# Patient Record
Sex: Female | Born: 1995
Health system: Southern US, Community
[De-identification: ages and names within clinical notes are randomized; demographics above are authoritative.]

## PROBLEM LIST (undated history)

## (undated) DIAGNOSIS — N390 Urinary tract infection, site not specified: Secondary | ICD-10-CM

## (undated) DIAGNOSIS — F329 Major depressive disorder, single episode, unspecified: Secondary | ICD-10-CM

## (undated) DIAGNOSIS — R1110 Vomiting, unspecified: Secondary | ICD-10-CM

## (undated) DIAGNOSIS — F1210 Cannabis abuse, uncomplicated: Secondary | ICD-10-CM

## (undated) DIAGNOSIS — F32A Depression, unspecified: Secondary | ICD-10-CM

## (undated) DIAGNOSIS — G43909 Migraine, unspecified, not intractable, without status migrainosus: Secondary | ICD-10-CM

---
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
## 2017-01-12 ENCOUNTER — Encounter (HOSPITAL_COMMUNITY): Payer: Self-pay | Admitting: Nurse Practitioner

## 2017-01-12 ENCOUNTER — Emergency Department (HOSPITAL_COMMUNITY)
Admission: EM | Admit: 2017-01-12 | Discharge: 2017-01-12 | Disposition: A | Discharge status: Home / Self Care | Payer: Medicaid Other | Attending: Emergency Medicine | Admitting: Emergency Medicine

## 2017-01-12 DIAGNOSIS — K2900 Acute gastritis without bleeding: Secondary | ICD-10-CM | POA: Insufficient documentation

## 2017-01-12 DIAGNOSIS — R112 Nausea with vomiting, unspecified: Secondary | ICD-10-CM | POA: Diagnosis not present

## 2017-01-12 DIAGNOSIS — R1013 Epigastric pain: Secondary | ICD-10-CM | POA: Diagnosis present

## 2017-01-12 DIAGNOSIS — F17200 Nicotine dependence, unspecified, uncomplicated: Secondary | ICD-10-CM | POA: Insufficient documentation

## 2017-01-12 DIAGNOSIS — F12988 Cannabis use, unspecified with other cannabis-induced disorder: Secondary | ICD-10-CM | POA: Insufficient documentation

## 2017-01-12 DIAGNOSIS — Z79899 Other long term (current) drug therapy: Secondary | ICD-10-CM | POA: Diagnosis not present

## 2017-01-12 DIAGNOSIS — F1290 Cannabis use, unspecified, uncomplicated: Secondary | ICD-10-CM

## 2017-01-12 HISTORY — DX: Migraine, unspecified, not intractable, without status migrainosus: G43.909

## 2017-01-12 LAB — COMPREHENSIVE METABOLIC PANEL
ALBUMIN: 5.3 g/dL — AB (ref 3.5–5.0)
ALK PHOS: 56 U/L (ref 38–126)
ALT: 13 U/L — ABNORMAL LOW (ref 14–54)
ANION GAP: 16 — AB (ref 5–15)
AST: 36 U/L (ref 15–41)
BUN: 10 mg/dL (ref 6–20)
CALCIUM: 10.2 mg/dL (ref 8.9–10.3)
CO2: 15 mmol/L — ABNORMAL LOW (ref 22–32)
Chloride: 109 mmol/L (ref 101–111)
Creatinine, Ser: 0.95 mg/dL (ref 0.44–1.00)
GFR calc non Af Amer: >60 mL/min (ref >60)
GLUCOSE: 168 mg/dL — AB (ref 65–99)
POTASSIUM: 3.8 mmol/L (ref 3.5–5.1)
Sodium: 140 mmol/L (ref 135–145)
TOTAL PROTEIN: 7.8 g/dL (ref 6.5–8.1)
Total Bilirubin: 0.8 mg/dL (ref 0.3–1.2)

## 2017-01-12 LAB — CBC
HCT: 42.2 % (ref 36.0–46.0)
HEMOGLOBIN: 14.9 g/dL (ref 12.0–15.0)
MCH: 32.5 pg (ref 26.0–34.0)
MCHC: 35.3 g/dL (ref 30.0–36.0)
MCV: 91.9 fL (ref 78.0–100.0)
Platelets: 289 10*3/uL (ref 150–400)
RBC: 4.59 MIL/uL (ref 3.87–5.11)
RDW: 12.3 % (ref 11.5–15.5)
WBC: 17.6 10*3/uL — ABNORMAL HIGH (ref 4.0–10.5)

## 2017-01-12 LAB — RAPID URINE DRUG SCREEN, HOSP PERFORMED
AMPHETAMINES: NOT DETECTED
Barbiturates: NOT DETECTED
Benzodiazepines: NOT DETECTED
COCAINE: NOT DETECTED
OPIATES: NOT DETECTED
Tetrahydrocannabinol: POSITIVE — AB

## 2017-01-12 LAB — URINALYSIS, ROUTINE W REFLEX MICROSCOPIC
Bilirubin Urine: NEGATIVE
GLUCOSE, UA: NEGATIVE mg/dL
KETONES UR: 20 mg/dL — AB
LEUKOCYTES UA: NEGATIVE
NITRITE: NEGATIVE
PH: 6 (ref 5.0–8.0)
Protein, ur: NEGATIVE mg/dL
Specific Gravity, Urine: 1.019 (ref 1.005–1.030)

## 2017-01-12 LAB — I-STAT BETA HCG BLOOD, ED (MC, WL, AP ONLY): I-stat hCG, quantitative: <5 m[IU]/mL (ref <5)

## 2017-01-12 MED ORDER — FAMOTIDINE IN NACL 20-0.9 MG/50ML-% IV SOLN
20.0000 mg | Freq: Once | INTRAVENOUS | Status: AC
  Administered 2017-01-12: 20 mg via INTRAVENOUS
  Filled 2017-01-12: qty 50

## 2017-01-12 MED ORDER — ONDANSETRON HCL 4 MG/2ML IJ SOLN
4.0000 mg | Freq: Once | INTRAMUSCULAR | Status: AC
  Administered 2017-01-12: 4 mg via INTRAVENOUS
  Filled 2017-01-12: qty 2

## 2017-01-12 MED ORDER — SODIUM CHLORIDE 0.9 % IV BOLUS (SEPSIS)
500.0000 mL | Freq: Once | INTRAVENOUS | Status: AC
  Administered 2017-01-12: 500 mL via INTRAVENOUS

## 2017-01-12 MED ORDER — SODIUM CHLORIDE 0.9 % IV BOLUS (SEPSIS)
1000.0000 mL | Freq: Once | INTRAVENOUS | Status: AC
  Administered 2017-01-12: 1000 mL via INTRAVENOUS

## 2017-01-12 MED ORDER — ONDANSETRON 4 MG PO TBDP: 4.0000 mg | ORAL_TABLET | Freq: Three times a day (TID) | ORAL | 0 refills | Status: DC | PRN

## 2017-01-12 MED ORDER — LORAZEPAM 2 MG/ML IJ SOLN
1.0000 mg | Freq: Once | INTRAMUSCULAR | Status: AC
  Administered 2017-01-12: 1 mg via INTRAVENOUS
  Filled 2017-01-12: qty 1

## 2017-01-12 MED ORDER — METOCLOPRAMIDE HCL 5 MG/ML IJ SOLN
10.0000 mg | Freq: Once | INTRAMUSCULAR | Status: AC
  Administered 2017-01-12: 10 mg via INTRAVENOUS
  Filled 2017-01-12: qty 2

## 2017-01-12 NOTE — ED Provider Notes (Signed)
[facility]-EMERGENCY DEPT Provider Note   CSN: [id number] Arrival date & time: 01/12/17  1207     History   Chief Complaint Chief Complaint  Patient presents with  . Emesis    HPI Tiffany Williamson is a 21 y.o. female.  Patient w recurrent nausea and vomiting in the past day. Several episodes. No bloody emesis. +epigastric pain, dull, moderate, non radiating. No abd distension. No diarrhea. Is having normal bms. No prior abd surgeries, no dysuria or gu c/o.  Hx ibs. Parent indicates had ibs workup including colonoscopy which was fine. No fever or chills. Lnmp: on current.       Past Medical History:  Diagnosis Date  . Migraine     Patient Active Problem List   Diagnosis Date Noted  . Vomiting 10/28/2016  . Intractable vomiting 10/28/2016  . Depression 10/28/2016  . Persistent proteinuria 10/28/2016  . Abdominal pain 10/28/2016  . Total bilirubin, elevated 10/28/2016  . Hypokalemia 10/28/2016    History reviewed. No pertinent surgical history.  OB History    No data available       Home Medications    Prior to Admission medications   Medication Sig Start Date End Date Taking? Authorizing Provider  amoxicillin (AMOXIL) 500 MG capsule Take 1 capsule (500 mg total) by mouth 2 (two) times daily. 10/29/16   Arnetha Courser, MD  FLUoxetine (PROZAC) 20 MG capsule Take 20 mg by mouth daily.    [provider]  ondansetron (ZOFRAN) 4 MG tablet Take 1 tablet (4 mg total) by mouth every 8 (eight) hours as needed for nausea or vomiting. 10/15/16   Tegeler, Canary Brim, MD  pantoprazole (PROTONIX) 40 MG tablet Take 1 tablet (40 mg total) by mouth daily. 10/30/16   Arnetha Courser, MD  sucralfate (CARAFATE) 1 GM/10ML suspension Take 10 mLs (1 g total) by mouth 4 (four) times daily -  with meals and at bedtime. 10/29/16   Arnetha Courser, MD    Family History History reviewed. No pertinent family history.  Social History Social History  Substance Use Topics  . Smoking status:  Current Every Day Smoker    Packs/day: 0.50  . Smokeless tobacco: Never Used  . Alcohol use No     Allergies   Cinnamon and Nickel   Review of Systems Review of Systems  Constitutional: Negative for fever.  HENT: Negative for sore throat.   Eyes: Negative for redness.  Respiratory: Negative for shortness of breath.   Cardiovascular: Negative for chest pain.  Gastrointestinal: Positive for abdominal pain and vomiting.  Endocrine: Negative for polyuria.  Genitourinary: Negative for dysuria.  Musculoskeletal: Negative for back pain.  Skin: Negative for rash.  Neurological: Negative for headaches.  Hematological: Does not bruise/bleed easily.  Psychiatric/Behavioral: Negative for confusion.     Physical Exam Updated Vital Signs BP 125/87 (BP Location: Right Arm)   Pulse 83   Temp 97.6 F (36.4 C) (Oral)   Resp 20   SpO2 100%   Physical Exam  Constitutional: She appears well-developed and well-nourished. No distress.  HENT:  Mouth/Throat: Oropharynx is clear and moist.  Eyes: Conjunctivae are normal. No scleral icterus.  Neck: Neck supple. No tracheal deviation present.  Cardiovascular: Normal rate, regular rhythm, normal heart sounds and intact distal pulses.  Exam reveals no gallop and no friction rub.   No murmur heard. Pulmonary/Chest: Effort normal and breath sounds normal. No respiratory distress.  Abdominal: Soft. Normal appearance and bowel sounds are normal. She exhibits no distension and no  mass. There is no tenderness. There is no rebound and no guarding. No hernia.  Genitourinary:  Genitourinary Comments: No cva tenderness  Musculoskeletal: She exhibits no edema.  Neurological: She is alert.  Skin: Skin is warm and dry. No rash noted. She is not diaphoretic.  Psychiatric: She has a normal mood and affect.  Nursing note and vitals reviewed.    ED Treatments / Results  Labs (all labs ordered are listed, but only abnormal results are  displayed)  Results for orders placed or performed during the hospital encounter of 01/12/17  Comprehensive metabolic panel  Result Value Ref Range   Sodium 140 135 - 145 mmol/L   Potassium 3.8 3.5 - 5.1 mmol/L   Chloride 109 101 - 111 mmol/L   CO2 15 (L) 22 - 32 mmol/L   Glucose, Bld 168 (H) 65 - 99 mg/dL   BUN 10 6 - 20 mg/dL   Creatinine, Ser 1.61 0.44 - 1.00 mg/dL   Calcium 09.6 8.9 - 04.5 mg/dL   Total Protein 7.8 6.5 - 8.1 g/dL   Albumin 5.3 (H) 3.5 - 5.0 g/dL   AST 36 15 - 41 U/L   ALT 13 (L) 14 - 54 U/L   Alkaline Phosphatase 56 38 - 126 U/L   Total Bilirubin 0.8 0.3 - 1.2 mg/dL   GFR calc non Af Amer >60 >60 mL/min   GFR calc Af Amer >60 >60 mL/min   Anion gap 16 (H) 5 - 15  CBC  Result Value Ref Range   WBC 17.6 (H) 4.0 - 10.5 K/uL   RBC 4.59 3.87 - 5.11 MIL/uL   Hemoglobin 14.9 12.0 - 15.0 g/dL   HCT 40.9 81.1 - 91.4 %   MCV 91.9 78.0 - 100.0 fL   MCH 32.5 26.0 - 34.0 pg   MCHC 35.3 30.0 - 36.0 g/dL   RDW 78.2 95.6 - 21.3 %   Platelets 289 150 - 400 K/uL  Urinalysis, Routine w reflex microscopic  Result Value Ref Range   Color, Urine YELLOW YELLOW   APPearance HAZY (A) CLEAR   Specific Gravity, Urine 1.019 1.005 - 1.030   pH 6.0 5.0 - 8.0   Glucose, UA NEGATIVE NEGATIVE mg/dL   Hgb urine dipstick SMALL (A) NEGATIVE   Bilirubin Urine NEGATIVE NEGATIVE   Ketones, ur 20 (A) NEGATIVE mg/dL   Protein, ur NEGATIVE NEGATIVE mg/dL   Nitrite NEGATIVE NEGATIVE   Leukocytes, UA NEGATIVE NEGATIVE   RBC / HPF 0-5 0 - 5 RBC/hpf   WBC, UA 0-5 0 - 5 WBC/hpf   Bacteria, UA RARE (A) NONE SEEN   Squamous Epithelial / LPF 6-30 (A) NONE SEEN   Mucous PRESENT    Budding Yeast PRESENT   Rapid urine drug screen (hospital performed)  Result Value Ref Range   Opiates NONE DETECTED NONE DETECTED   Cocaine NONE DETECTED NONE DETECTED   Benzodiazepines NONE DETECTED NONE DETECTED   Amphetamines NONE DETECTED NONE DETECTED   Tetrahydrocannabinol POSITIVE (A) NONE DETECTED    Barbiturates NONE DETECTED NONE DETECTED  I-Stat beta hCG blood, ED  Result Value Ref Range   I-stat hCG, quantitative <5.0 <5 mIU/mL   Comment 3             EKG  EKG Interpretation None       Radiology No results found.  Procedures Procedures (including critical care time)  Medications Ordered in ED Medications  sodium chloride 0.9 % bolus 1,000 mL (not administered)  ondansetron (ZOFRAN) injection  4 mg (not administered)     Initial Impression / Assessment and Plan / ED Course  I have reviewed the triage vital signs and the nursing notes.  Pertinent labs & imaging results that were available during my care of the patient were reviewed by me and considered in my medical decision making (see chart for details).  Iv ns bolus. zofran iv.   Labs sent.   Reviewed nursing notes and prior charts for additional history.   Iv ns bolus.   Recurrent nv.   reglan iv. Pt anxious. Ativan iv.   pepcid iv.  Recheck symptoms improved. abd soft nt.   Trial po fluids, snack.  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   Pt re-hydrated w ivf. Tolerating po.  Pt appears stable for d/c.     Final Clinical Impressions(s) / ED Diagnoses   Final diagnoses:  None    New Prescriptions New Prescriptions   No medications on file     Cathren LaineSteinl, Daniya Aramburo, MD 01/12/17 1559

## 2017-01-12 NOTE — Discharge Instructions (Signed)
It was our pleasure to provide your ER care today - we hope that you feel better.  Rest. Drink plenty of fluids.  It is important to know that marijuana use can cause a recurrent abdominal pain and vomiting syndrome in otherwise health people.  We see it very frequently in young adults with a recurrent vomiting and abd pain syndrome.   Avoid any marijuana use.   You may try taking pepcid and maalox as need for symptom relief.   Follow up with primary care doctor in the coming week.  Return to ER if worse, new symptoms, fevers, persistent vomiting, other concern.   You were given medication in the ER - no driving for the next 6 hours.

## 2017-01-12 NOTE — ED Notes (Signed)
Pt given sprite and graham crackers. Pt tolerating sprite ok.

## 2017-01-12 NOTE — ED Triage Notes (Signed)
Pt presents with c/o vomiting. Her symptoms began in April. She reports severe abdominal pain and vomiting every morning when she wakes up. She has [facility]elonephritis, [facility] and her [facility] office but symptoms continue. She is currently on antibiotic therapy for UTI. Her PCP office referred her to emergency department today for further evaluation.

## 2017-04-25 ENCOUNTER — Encounter (HOSPITAL_BASED_OUTPATIENT_CLINIC_OR_DEPARTMENT_OTHER): Payer: Self-pay | Admitting: Emergency Medicine

## 2017-04-25 ENCOUNTER — Emergency Department (HOSPITAL_COMMUNITY): Admission: EM | Admit: 2017-04-25 | Discharge: 2017-04-25 | Discharge status: Against Medical Advice | Payer: Self-pay

## 2017-04-25 ENCOUNTER — Observation Stay (HOSPITAL_BASED_OUTPATIENT_CLINIC_OR_DEPARTMENT_OTHER)
Admission: EM | Admit: 2017-04-25 | Discharge: 2017-04-26 | Disposition: A | Discharge status: Home / Self Care | Payer: Medicaid Other | Attending: Family Medicine | Admitting: Family Medicine

## 2017-04-25 DIAGNOSIS — Z79899 Other long term (current) drug therapy: Secondary | ICD-10-CM | POA: Insufficient documentation

## 2017-04-25 DIAGNOSIS — R1110 Vomiting, unspecified: Secondary | ICD-10-CM | POA: Diagnosis present

## 2017-04-25 DIAGNOSIS — F12988 Cannabis use, unspecified with other cannabis-induced disorder: Secondary | ICD-10-CM | POA: Diagnosis not present

## 2017-04-25 DIAGNOSIS — R1115 Cyclical vomiting syndrome unrelated to migraine: Secondary | ICD-10-CM

## 2017-04-25 DIAGNOSIS — R197 Diarrhea, unspecified: Secondary | ICD-10-CM | POA: Insufficient documentation

## 2017-04-25 DIAGNOSIS — G43A1 Cyclical vomiting, intractable: Secondary | ICD-10-CM | POA: Diagnosis not present

## 2017-04-25 DIAGNOSIS — E876 Hypokalemia: Secondary | ICD-10-CM | POA: Insufficient documentation

## 2017-04-25 DIAGNOSIS — R112 Nausea with vomiting, unspecified: Secondary | ICD-10-CM

## 2017-04-25 DIAGNOSIS — F17210 Nicotine dependence, cigarettes, uncomplicated: Secondary | ICD-10-CM | POA: Insufficient documentation

## 2017-04-25 DIAGNOSIS — R109 Unspecified abdominal pain: Secondary | ICD-10-CM

## 2017-04-25 HISTORY — DX: Urinary tract infection, site not specified: N39.0

## 2017-04-25 LAB — CBC WITH DIFFERENTIAL/PLATELET
Basophils Absolute: 0 10*3/uL (ref 0.0–0.1)
Basophils Relative: 0 %
Eosinophils Absolute: 0 10*3/uL (ref 0.0–0.7)
Eosinophils Relative: 0 %
HEMATOCRIT: 37.6 % (ref 36.0–46.0)
Hemoglobin: 12.9 g/dL (ref 12.0–15.0)
LYMPHS ABS: 0.5 10*3/uL — AB (ref 0.7–4.0)
LYMPHS PCT: 10 %
MCH: 31.7 pg (ref 26.0–34.0)
MCHC: 34.3 g/dL (ref 30.0–36.0)
MCV: 92.4 fL (ref 78.0–100.0)
MONO ABS: 0.2 10*3/uL (ref 0.1–1.0)
MONOS PCT: 5 %
NEUTROS ABS: 4.5 10*3/uL (ref 1.7–7.7)
Neutrophils Relative %: 85 %
Platelets: 177 10*3/uL (ref 150–400)
RBC: 4.07 MIL/uL (ref 3.87–5.11)
RDW: 12.4 % (ref 11.5–15.5)
WBC: 5.3 10*3/uL (ref 4.0–10.5)

## 2017-04-25 LAB — COMPREHENSIVE METABOLIC PANEL
ALT: 17 U/L (ref 14–54)
AST: 32 U/L (ref 15–41)
Albumin: 4.2 g/dL (ref 3.5–5.0)
Alkaline Phosphatase: 45 U/L (ref 38–126)
Anion gap: 9 (ref 5–15)
BILIRUBIN TOTAL: 0.8 mg/dL (ref 0.3–1.2)
BUN: 6 mg/dL (ref 6–20)
CALCIUM: 9 mg/dL (ref 8.9–10.3)
CO2: 20 mmol/L — ABNORMAL LOW (ref 22–32)
Chloride: 110 mmol/L (ref 101–111)
Creatinine, Ser: 0.7 mg/dL (ref 0.44–1.00)
GFR calc Af Amer: >60 mL/min (ref >60)
Glucose, Bld: 120 mg/dL — ABNORMAL HIGH (ref 65–99)
POTASSIUM: 3.4 mmol/L — AB (ref 3.5–5.1)
Sodium: 139 mmol/L (ref 135–145)
TOTAL PROTEIN: 6.3 g/dL — AB (ref 6.5–8.1)

## 2017-04-25 LAB — I-STAT CHEM 8, ED
BUN: 6 mg/dL (ref 6–20)
CREATININE: 0.5 mg/dL (ref 0.44–1.00)
Calcium, Ion: 1.19 mmol/L (ref 1.15–1.40)
Chloride: 108 mmol/L (ref 101–111)
GLUCOSE: 120 mg/dL — AB (ref 65–99)
HCT: 38 % (ref 36.0–46.0)
HEMOGLOBIN: 12.9 g/dL (ref 12.0–15.0)
POTASSIUM: 3.5 mmol/L (ref 3.5–5.1)
Sodium: 142 mmol/L (ref 135–145)
TCO2: 20 mmol/L — ABNORMAL LOW (ref 22–32)

## 2017-04-25 LAB — C DIFFICILE QUICK SCREEN W PCR REFLEX
C DIFFICILE (CDIFF) INTERP: NOT DETECTED
C DIFFICILE (CDIFF) TOXIN: NEGATIVE
C Diff antigen: NEGATIVE

## 2017-04-25 LAB — PREGNANCY, URINE: Preg Test, Ur: NEGATIVE

## 2017-04-25 LAB — LIPASE, BLOOD: LIPASE: 16 U/L (ref 11–51)

## 2017-04-25 MED ORDER — ONDANSETRON HCL 4 MG/2ML IJ SOLN
4.0000 mg | Freq: Four times a day (QID) | INTRAMUSCULAR | Status: DC | PRN
  Administered 2017-04-25 – 2017-04-26 (×2): 4 mg via INTRAVENOUS
  Filled 2017-04-25 (×2): qty 2

## 2017-04-25 MED ORDER — SODIUM CHLORIDE 0.9 % IV BOLUS (SEPSIS)
1000.0000 mL | Freq: Once | INTRAVENOUS | Status: AC
  Administered 2017-04-25: 1000 mL via INTRAVENOUS

## 2017-04-25 MED ORDER — ACETAMINOPHEN 325 MG PO TABS: 650.0000 mg | ORAL_TABLET | Freq: Four times a day (QID) | ORAL | Status: DC | PRN

## 2017-04-25 MED ORDER — DICYCLOMINE HCL 10 MG/ML IM SOLN
20.0000 mg | Freq: Once | INTRAMUSCULAR | Status: AC
  Administered 2017-04-25: 20 mg via INTRAMUSCULAR
  Filled 2017-04-25: qty 2

## 2017-04-25 MED ORDER — FLUOXETINE HCL 20 MG PO CAPS
20.0000 mg | ORAL_CAPSULE | Freq: Every day | ORAL | Status: DC
  Administered 2017-04-26: 20 mg via ORAL
  Filled 2017-04-25: qty 1

## 2017-04-25 MED ORDER — PANTOPRAZOLE SODIUM 40 MG PO TBEC
40.0000 mg | DELAYED_RELEASE_TABLET | Freq: Every day | ORAL | Status: DC
  Administered 2017-04-26: 40 mg via ORAL
  Filled 2017-04-25: qty 1

## 2017-04-25 MED ORDER — DEXTROSE-NACL 5-0.45 % IV SOLN
INTRAVENOUS | Status: DC
  Administered 2017-04-25: 18:00:00 via INTRAVENOUS

## 2017-04-25 MED ORDER — ENOXAPARIN SODIUM 40 MG/0.4ML ~~LOC~~ SOLN
40.0000 mg | SUBCUTANEOUS | Status: DC
  Administered 2017-04-25: 40 mg via SUBCUTANEOUS
  Filled 2017-04-25: qty 0.4

## 2017-04-25 MED ORDER — ONDANSETRON HCL 4 MG/2ML IJ SOLN
4.0000 mg | Freq: Once | INTRAMUSCULAR | Status: AC
  Administered 2017-04-25: 4 mg via INTRAVENOUS
  Filled 2017-04-25: qty 2

## 2017-04-25 MED ORDER — DICYCLOMINE HCL 10 MG/ML IM SOLN
10.0000 mg | Freq: Once | INTRAMUSCULAR | Status: AC | PRN
  Administered 2017-04-25: 10 mg via INTRAMUSCULAR
  Filled 2017-04-25: qty 1

## 2017-04-25 MED ORDER — POTASSIUM CHLORIDE IN NACL 20-0.9 MEQ/L-% IV SOLN
INTRAVENOUS | Status: DC
  Administered 2017-04-25: 23:00:00 via INTRAVENOUS
  Filled 2017-04-25 (×4): qty 1000

## 2017-04-25 MED ORDER — HALOPERIDOL LACTATE 5 MG/ML IJ SOLN
2.0000 mg | Freq: Once | INTRAMUSCULAR | Status: AC
  Administered 2017-04-25: 2 mg via INTRAVENOUS
  Filled 2017-04-25: qty 1

## 2017-04-25 MED ORDER — ETONOGESTREL 68 MG ~~LOC~~ IMPL: 1.0000 | DRUG_IMPLANT | Freq: Once | SUBCUTANEOUS | Status: DC

## 2017-04-25 MED ORDER — MORPHINE SULFATE (PF) 4 MG/ML IV SOLN
0.5000 mg | INTRAVENOUS | Status: DC | PRN
  Administered 2017-04-26: 0.52 mg via INTRAVENOUS
  Filled 2017-04-25: qty 1

## 2017-04-25 MED ORDER — SUCRALFATE 1 GM/10ML PO SUSP
1.0000 g | Freq: Three times a day (TID) | ORAL | Status: DC
  Administered 2017-04-25 – 2017-04-26 (×2): 1 g via ORAL
  Filled 2017-04-25 (×2): qty 10

## 2017-04-25 MED ORDER — CAPSAICIN 0.025 % EX CREA
TOPICAL_CREAM | Freq: Once | CUTANEOUS | Status: AC
  Administered 2017-04-25: 1 via TOPICAL
  Filled 2017-04-25 (×2): qty 60

## 2017-04-25 MED ORDER — METOCLOPRAMIDE HCL 5 MG/ML IJ SOLN
10.0000 mg | Freq: Once | INTRAMUSCULAR | Status: AC
  Administered 2017-04-25: 10 mg via INTRAVENOUS
  Filled 2017-04-25: qty 2

## 2017-04-25 MED ORDER — ACETAMINOPHEN 650 MG RE SUPP: 650.0000 mg | Freq: Four times a day (QID) | RECTAL | Status: DC | PRN

## 2017-04-25 MED ORDER — DIPHENHYDRAMINE HCL 50 MG/ML IJ SOLN: 25.0000 mg | Freq: Four times a day (QID) | INTRAMUSCULAR | Status: DC | PRN

## 2017-04-25 NOTE — Progress Notes (Signed)
21 year old lady with prior h/o cyclical vomiting, comes in for persistent nausea and vomiting, diarrhea for 3 to 4 days.  VS wnl.  [facility]    Kathlen ModyVijaya Karigan Cloninger, MD [phone]

## 2017-04-25 NOTE — ED Notes (Signed)
Pt c/o pain and nausea, EDP made aware ? ?

## 2017-04-25 NOTE — H&P (Signed)
[facility] H&P   Patient Demographics:    Tiffany Williamson, is a 21 y.o. female  MRN: [id number]   DOB - 1996/03/12  Admit Date - 04/25/2017  Outpatient Primary MD for the patient is [facility], [place]  Referring MD/NP/PA: Alvira Monday  Outpatient Specialists: [facility]???  Patient coming from: home  Chief Complaint  Patient presents with  . Emesis      HPI:    Tiffany Williamson  is a 21 y.o. female, w Migraines, has c/o n/v intractable for the past 2 days since discharge from [facility].  Pt also notes RUQ pain.  + diarrhea, 20, loose stool in the past 2 days.  Pt denies fever, chills, constipation, brbpr, black stool.  Pt presented to ED for evaluation.    In ED,  Pt was noted to have Wbc 5.3, Hgb 12.9, Plt 177 Na 139, K 3.4, Glucose 120,  Bun 6, Creatinine 0.70 Ast 32, Alt 17  Pt will be admitted for intractable n/v, abdominal pain, diarrhea.        Review of systems:    In addition to the HPI above,  No Fever-chills, No Headache, No changes with Vision or hearing, No problems swallowing food or Liquids, No Chest pain, Cough or Shortness of Breath, No Blood in stool or Urine, No dysuria, No new skin rashes or bruises, No new joints pains-aches,  No new weakness, tingling, numbness in any extremity, No recent weight gain or loss, No polyuria, polydypsia or polyphagia, No significant Mental Stressors.  A full 10 point Review of Systems was done, except as stated above, all other Review of Systems were negative.   With Past History of the following :    Past Medical History:  Diagnosis Date  . Chronic UTI   . Migraine       History reviewed. No pertinent surgical history.    Social History:     Social History  Substance Use Topics  . Smoking status: Current Every Day Smoker    Packs/day: 0.50    Types: Cigarettes  .  Smokeless tobacco: Never Used  . Alcohol use No     Lives - at home  Mobility - walks at home   Family History :    History reviewed. No pertinent family history.    Home Medications:   Prior to Admission medications   Medication Sig Start Date End Date Taking? Authorizing Provider  etonogestrel (NEXPLANON) 68 MG IMPL implant 1 each by Subdermal route once.   Yes [provider]  FLUoxetine (PROZAC) 20 MG capsule Take 20 mg by mouth daily.   Yes [provider]  levonorgestrel-ethinyl estradiol (NORDETTE) 0.15-30 MG-MCG tablet Take 1 tablet by mouth daily.   Yes [provider]  pantoprazole (PROTONIX) 40 MG tablet Take 1 tablet (40 mg total) by mouth daily. 10/30/16  Yes Arnetha Courser, MD  sulfamethoxazole-trimethoprim (BACTRIM,SEPTRA) 400-80  MG tablet Take 1 tablet by mouth 2 (two) times daily.   Yes [provider]  amoxicillin (AMOXIL) 500 MG capsule Take 1 capsule (500 mg total) by mouth 2 (two) times daily. 10/29/16   Arnetha Courser, MD  ondansetron (ZOFRAN ODT) 4 MG disintegrating tablet Take 1 tablet (4 mg total) by mouth every 8 (eight) hours as needed for nausea or vomiting. 01/12/17   Pricilla Loveless, MD  ondansetron (ZOFRAN) 4 MG tablet Take 1 tablet (4 mg total) by mouth every 8 (eight) hours as needed for nausea or vomiting. 10/15/16   Tegeler, Canary Brim, MD  sucralfate (CARAFATE) 1 GM/10ML suspension Take 10 mLs (1 g total) by mouth 4 (four) times daily -  with meals and at bedtime. 10/29/16   Arnetha Courser, MD     Allergies:     Allergies  Allergen Reactions  . Cinnamon   . Nickel Rash    Not specified      Physical Exam:   Vitals  Blood pressure (!) 129/91, pulse 63, temperature 97.9 F (36.6 C), temperature source Oral, resp. rate 18, height 5\' 4"  (1.626 m), weight 46.7 kg (103 lb), SpO2 99 %.   1. General  lying in bed in NAD,    2. Normal affect and insight, Not Suicidal or Homicidal, Awake Alert, Oriented X 3.  3.  No F.N deficits, ALL C.Nerves Intact, Strength 5/5 all 4 extremities, Sensation intact all 4 extremities, Plantars down going.  4. Ears and Eyes appear Normal, Conjunctivae clear, PERRLA. Moist Oral Mucosa.  5. Supple Neck, No JVD, No cervical lymphadenopathy appriciated, No Carotid Bruits.  6. Symmetrical Chest wall movement, Good air movement bilaterally, CTAB.  7. RRR, No Gallops, Rubs or Murmurs, No Parasternal Heave.  8. Positive Bowel Sounds, Abdomen Soft, No tenderness, No organomegaly appriciated,No rebound -guarding or rigidity.  9.  No Cyanosis, Normal Skin Turgor, No Skin Rash or Bruise.  10. Good muscle tone,  joints appear normal , no effusions, Normal ROM.  11. No Palpable Lymph Nodes in Neck or Axillae     Data Review:    CBC  Recent Labs Lab 04/25/17 0807 04/25/17 0821  WBC 5.3  --   HGB 12.9 12.9  HCT 37.6 38.0  PLT 177  --   MCV 92.4  --   MCH 31.7  --   MCHC 34.3  --   RDW 12.4  --   LYMPHSABS 0.5*  --   MONOABS 0.2  --   EOSABS 0.0  --   BASOSABS 0.0  --    ------------------------------------------------------------------------------------------------------------------  Chemistries   Recent Labs Lab 04/25/17 0807 04/25/17 0821  NA 139 142  K 3.4* 3.5  CL 110 108  CO2 20*  --   GLUCOSE 120* 120*  BUN 6 6  CREATININE 0.70 0.50  CALCIUM 9.0  --   AST 32  --   ALT 17  --   ALKPHOS 45  --   BILITOT 0.8  --    ------------------------------------------------------------------------------------------------------------------ estimated creatinine clearance is 83.4 mL/min (by C-G formula based on SCr of 0.5 mg/dL). ------------------------------------------------------------------------------------------------------------------ No results for input(s): TSH, T4TOTAL, T3FREE, THYROIDAB in the last 72 hours.  Invalid input(s): FREET3  Coagulation profile No results for input(s): INR, PROTIME in the last 168  hours. ------------------------------------------------------------------------------------------------------------------- No results for input(s): DDIMER in the last 72 hours. -------------------------------------------------------------------------------------------------------------------  Cardiac Enzymes No results for input(s): CKMB, TROPONINI, MYOGLOBIN in the last 168 hours.  Invalid input(s): CK ------------------------------------------------------------------------------------------------------------------ No results found for: BNP   ---------------------------------------------------------------------------------------------------------------  Urinalysis    Component Value Date/Time   COLORURINE YELLOW 01/12/2017 1440   APPEARANCEUR HAZY (A) 01/12/2017 1440   LABSPEC 1.019 01/12/2017 1440   PHURINE 6.0 01/12/2017 1440   GLUCOSEU NEGATIVE 01/12/2017 1440   HGBUR SMALL (A) 01/12/2017 1440   BILIRUBINUR NEGATIVE 01/12/2017 1440   KETONESUR 20 (A) 01/12/2017 1440   PROTEINUR NEGATIVE 01/12/2017 1440   NITRITE NEGATIVE 01/12/2017 1440   LEUKOCYTESUR NEGATIVE 01/12/2017 1440    ----------------------------------------------------------------------------------------------------------------   Imaging Results:    No results found.    Assessment & Plan:    Principal Problem:   Intractable vomiting Active Problems:   Hypokalemia  Intractable n/v zofran iv protonix  Abdominal pain Check lipase protonix Abdominal ultrasound Consider GI consult in am  Hypokalemia Replete      DVT Prophylaxis Lovenox - SCDs  AM Labs Ordered, also please review Full Orders  Family Communication: Admission, patients condition and plan of care including tests being ordered have been discussed with the patient who indicate understanding and agree with the plan and Code Status.  Code Status FULL CODE  Likely DC to  home  Condition GUARDED    Consults called:  none  Admission status: inpatient   Time spent in minutes : 45   Pearson GrippeJames Mally Gavina M.D on 04/25/2017 at 9:00 PM  Between 7am to 7pm - Pager - [phone] After 7pm go to [email or web address] - password [email or web address]  [facility] - Office  [phone]

## 2017-04-25 NOTE — ED Notes (Signed)
Pt reports ongoing N/V/D. Requesting more medication. EDP made aware.

## 2017-04-25 NOTE — ED Provider Notes (Signed)
[facility] EMERGENCY DEPARTMENT Provider Note   CSN: [id number] Arrival date & time: 04/25/17  0720     History   Chief Complaint Chief Complaint  Patient presents with  . Emesis    HPI Tiffany Williamson is a 21 y.o. female.  HPI  Was [facility] with concern for nausea and vomiting and diarrhea.  Was admitted, had IV fluids, ate something then was discharged.  Then 9PM started vomiting again and diarrhea worsened.  Has vomited more than 20 times since last night.  Diarrhea has been at the same time as vomiting, about 20 times.  No black or bloody stool.  Had small amt of blood in emesis yesterday but cleared up.  Has seen GI doctor, has had colonoscopy and endoscopy, digestion testing.  Was told was marijuana, has decreased use but not stopped. Last use was 2 days ago.  Last episode of emesis was 2 months ago.  Taking pepto, didn't get any rx.  Has phenergan suppositories but having such frequent diarrhea didn't take them.  On daily protonix.  No fevers, 99.7 high for her.  Boyfriend's little brother is frequently sick, was vomiting a few days before and she watched him at home.  On daily bactrim, for hx of chronic UTI.   Past Medical History:  Diagnosis Date  . Chronic UTI   . Migraine     Patient Active Problem List   Diagnosis Date Noted  . Vomiting 10/28/2016  . Intractable vomiting 10/28/2016  . Depression 10/28/2016  . Persistent proteinuria 10/28/2016  . Abdominal pain 10/28/2016  . Total bilirubin, elevated 10/28/2016  . Hypokalemia 10/28/2016    History reviewed. No pertinent surgical history.  OB History    No data available       Home Medications    Prior to Admission medications   Medication Sig Start Date End Date Taking? Authorizing Provider  pantoprazole (PROTONIX) 40 MG tablet Take 1 tablet (40 mg total) by mouth daily. 10/30/16  Yes Arnetha CourserAmin, Sumayya, MD  sulfamethoxazole-trimethoprim (BACTRIM,SEPTRA) 400-80 MG tablet  Take 1 tablet by mouth 2 (two) times daily.   Yes [provider]  amoxicillin (AMOXIL) 500 MG capsule Take 1 capsule (500 mg total) by mouth 2 (two) times daily. 10/29/16   Arnetha CourserAmin, Sumayya, MD  FLUoxetine (PROZAC) 20 MG capsule Take 20 mg by mouth daily.    [provider]  ondansetron (ZOFRAN ODT) 4 MG disintegrating tablet Take 1 tablet (4 mg total) by mouth every 8 (eight) hours as needed for nausea or vomiting. 01/12/17   Pricilla LovelessGoldston, Scott, MD  ondansetron (ZOFRAN) 4 MG tablet Take 1 tablet (4 mg total) by mouth every 8 (eight) hours as needed for nausea or vomiting. 10/15/16   Tegeler, Canary Brimhristopher J, MD  sucralfate (CARAFATE) 1 GM/10ML suspension Take 10 mLs (1 g total) by mouth 4 (four) times daily -  with meals and at bedtime. 10/29/16   Arnetha CourserAmin, Sumayya, MD    Family History No family history on file.  Social History Social History  Substance Use Topics  . Smoking status: Current Every Day Smoker    Packs/day: 0.50  . Smokeless tobacco: Never Used  . Alcohol use No     Allergies   Cinnamon and Nickel   Review of Systems Review of Systems  Constitutional: Positive for appetite change and fatigue. Negative for fever.  HENT: Negative for sore throat.   Eyes: Negative for visual disturbance.  Respiratory: Negative for cough and shortness of breath.  Cardiovascular: Negative for chest pain.  Gastrointestinal: Positive for abdominal pain (stabbing, cramping epigastric pain), diarrhea, nausea and vomiting.  Genitourinary: Negative for difficulty urinating and dysuria.  Musculoskeletal: Negative for back pain and neck pain.  Skin: Negative for rash.  Neurological: Positive for light-headedness. Negative for syncope and headaches.     Physical Exam Updated Vital Signs BP 121/69   Pulse 61   Temp (!) 97.4 F (36.3 C) (Oral)   Resp 16   Ht 5\' 4"  (1.626 m)   Wt 46.7 kg (103 lb)   SpO2 97%   BMI 17.68 kg/m   Physical Exam  Constitutional: She is oriented to  person, place, and time. She appears well-developed and well-nourished. No distress.  HENT:  Head: Normocephalic and atraumatic.  Eyes: Conjunctivae and EOM are normal.  Neck: Normal range of motion.  Cardiovascular: Normal rate, regular rhythm, normal heart sounds and intact distal pulses.  Exam reveals no gallop and no friction rub.   No murmur heard. Pulmonary/Chest: Effort normal and breath sounds normal. No respiratory distress. She has no wheezes. She has no rales.  Abdominal: Soft. She exhibits no distension. There is tenderness (mild diffuse). There is no guarding.  Musculoskeletal: She exhibits no edema or tenderness.  Neurological: She is alert and oriented to person, place, and time.  Skin: Skin is warm and dry. No rash noted. She is not diaphoretic. No erythema.  Nursing note and vitals reviewed.    ED Treatments / Results  Labs (all labs ordered are listed, but only abnormal results are displayed) Labs Reviewed  CBC WITH DIFFERENTIAL/PLATELET - Abnormal; Notable for the following:       Result Value   Lymphs Abs 0.5 (*)    All other components within normal limits  COMPREHENSIVE METABOLIC PANEL - Abnormal; Notable for the following:    Potassium 3.4 (*)    CO2 20 (*)    Glucose, Bld 120 (*)    Total Protein 6.3 (*)    All other components within normal limits  I-STAT CHEM 8, ED - Abnormal; Notable for the following:    Glucose, Bld 120 (*)    TCO2 20 (*)    All other components within normal limits  C DIFFICILE QUICK SCREEN W PCR REFLEX  LIPASE, BLOOD  PREGNANCY, URINE    EKG  EKG Interpretation None       Radiology No results found.  Procedures Procedures (including critical care time)  Medications Ordered in ED Medications  dextrose 5 %-0.45 % sodium chloride infusion (not administered)  sodium chloride 0.9 % bolus 1,000 mL (0 mLs Intravenous Stopped 04/25/17 0916)  haloperidol lactate (HALDOL) injection 2 mg (2 mg Intravenous Given 04/25/17  0817)  haloperidol lactate (HALDOL) injection 2 mg (2 mg Intravenous Given 04/25/17 1136)  dicyclomine (BENTYL) injection 20 mg (20 mg Intramuscular Given 04/25/17 1136)  ondansetron (ZOFRAN) injection 4 mg (4 mg Intravenous Given 04/25/17 1328)  sodium chloride 0.9 % bolus 1,000 mL (0 mLs Intravenous Stopped 04/25/17 1426)  capsaicin (ZOSTRIX) 0.025 % cream (1 application Topical Given 04/25/17 1330)  metoCLOPramide (REGLAN) injection 10 mg (10 mg Intravenous Given 04/25/17 1551)  sodium chloride 0.9 % bolus 1,000 mL (1,000 mLs Intravenous New Bag/Given 04/25/17 1550)     Initial Impression / Assessment and Plan / ED Course  I have reviewed the triage vital signs and the nursing notes.  Pertinent labs & imaging results that were available during my care of the patient were reviewed by me and considered in  my medical decision making (see chart for details).     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  Labs repeat repeated today which no significant abnormalities, mild hypokalemia.  Urinalysis done yesterday without UTI, given primary diarrhea and emesis will not repeat today.  Pregnancy test negative. Given frequent diarrhea, ordered C. Difficile which was negative.  Abdominal exam benign of low suspicion for appendicitis, cholecystitis, diverticulitis, obstruction.   Given IV fluids, Haldol for nausea and pain.  Patient initially with improvement following Haldol, however symptoms returned, with severe nausea.  She was given Haldol as well as bentyl again without significant improvement.  Gave Zofran IV and additional fluid without improvement.  She was then given capsaicin she feels made her symptoms worse.  This was followed by Reglan  IV.  Given patient's persistent nausea, vomiting despite several different nausea medications, we will plan to admit for control of symptoms.  Suspect most likely cannabinoid hyperemesis is etiology of vomiting, or possible intractable vomiting secondary to viral gastroenteritis. Patient awaiting med-surg bed at [facility] with Dr. Blake Divine accepting.   Final Clinical Impressions(s) / ED Diagnoses   Final diagnoses:  Cannabinoid hyperemesis syndrome (HCC)  Intractable cyclical vomiting with nausea  Diarrhea of presumed infectious origin    New Prescriptions New Prescriptions   No medications on file     Alvira Monday, MD 04/25/17 [phone]

## 2017-04-25 NOTE — ED Notes (Signed)
Pt reports no relief in her nausea or abd pain. Pt states she notes blood in her emesis now.

## 2017-04-25 NOTE — ED Triage Notes (Signed)
Vomiting x 2 days. Seen at [facility] ED and given phenergan and fluid but pt reports vomiting persists.

## 2017-04-25 NOTE — ED Notes (Signed)
Pt state she no longer wants to be seen here she will go to free standing Ed; pt's arm band removed and seen exiting ED with family by RN

## 2017-04-25 NOTE — Progress Notes (Signed)
On call physician paged for patient's orders.

## 2017-04-25 NOTE — ED Notes (Signed)
ED Provider at bedside. 

## 2017-04-25 NOTE — ED Notes (Signed)
Report to [place] with [facility]

## 2017-04-25 NOTE — ED Notes (Signed)
[facility] arrived to transport pt 

## 2017-04-26 ENCOUNTER — Observation Stay (HOSPITAL_COMMUNITY): Payer: Medicaid Other

## 2017-04-26 DIAGNOSIS — R1110 Vomiting, unspecified: Secondary | ICD-10-CM

## 2017-04-26 DIAGNOSIS — R112 Nausea with vomiting, unspecified: Secondary | ICD-10-CM | POA: Diagnosis not present

## 2017-04-26 DIAGNOSIS — G43A1 Cyclical vomiting, intractable: Secondary | ICD-10-CM | POA: Diagnosis not present

## 2017-04-26 DIAGNOSIS — E876 Hypokalemia: Secondary | ICD-10-CM | POA: Diagnosis not present

## 2017-04-26 DIAGNOSIS — F12988 Cannabis use, unspecified with other cannabis-induced disorder: Secondary | ICD-10-CM

## 2017-04-26 DIAGNOSIS — F1210 Cannabis abuse, uncomplicated: Secondary | ICD-10-CM

## 2017-04-26 LAB — CBC
HCT: 35.3 % — ABNORMAL LOW (ref 36.0–46.0)
Hemoglobin: 12.3 g/dL (ref 12.0–15.0)
MCH: 32.2 pg (ref 26.0–34.0)
MCHC: 34.8 g/dL (ref 30.0–36.0)
MCV: 92.4 fL (ref 78.0–100.0)
PLATELETS: 168 10*3/uL (ref 150–400)
RBC: 3.82 MIL/uL — AB (ref 3.87–5.11)
RDW: 12.9 % (ref 11.5–15.5)
WBC: 6.8 10*3/uL (ref 4.0–10.5)

## 2017-04-26 LAB — COMPREHENSIVE METABOLIC PANEL
ALT: 26 U/L (ref 14–54)
AST: 40 U/L (ref 15–41)
Albumin: 3.6 g/dL (ref 3.5–5.0)
Alkaline Phosphatase: 37 U/L — ABNORMAL LOW (ref 38–126)
Anion gap: 8 (ref 5–15)
BUN: <5 mg/dL — ABNORMAL LOW (ref 6–20)
CHLORIDE: 109 mmol/L (ref 101–111)
CO2: 20 mmol/L — ABNORMAL LOW (ref 22–32)
CREATININE: 0.5 mg/dL (ref 0.44–1.00)
Calcium: 8.3 mg/dL — ABNORMAL LOW (ref 8.9–10.3)
Glucose, Bld: 99 mg/dL (ref 65–99)
POTASSIUM: 3.5 mmol/L (ref 3.5–5.1)
Sodium: 137 mmol/L (ref 135–145)
Total Bilirubin: 0.8 mg/dL (ref 0.3–1.2)
Total Protein: 5.7 g/dL — ABNORMAL LOW (ref 6.5–8.1)

## 2017-04-26 LAB — GASTROINTESTINAL PANEL BY PCR, STOOL (REPLACES STOOL CULTURE)

## 2017-04-26 MED ORDER — SACCHAROMYCES BOULARDII 250 MG PO CAPS: 250.0000 mg | ORAL_CAPSULE | Freq: Two times a day (BID) | ORAL | 0 refills | Status: DC

## 2017-04-26 NOTE — Discharge Summary (Addendum)
Physician Discharge Summary  Tiffany Williamson WUJ:[id number] DOB: 02/21/1996 DOA: 04/25/2017  PCP: [facility] [place]  Admit date: 04/25/2017 Discharge date: 04/26/2017  Time spent: 30 minutes  Recommendations for Outpatient Follow-up:  1. Follow-up PCP in 2 weeks   Discharge Diagnoses:  Principal Problem:   Intractable vomiting Active Problems:   Hypokalemia   Nausea & vomiting   Discharge Condition: Stable  Diet recommendation: Regular diet  Filed Weights   04/25/17 0725 04/25/17 2001  Weight: 46.7 kg (103 lb) 46.7 kg (103 lb)    History of present illness:   Tiffany Williamson  is a 21 y.o. female, w Migraines, has c/o n/v intractable for the past 2 days since discharge from [facility].  Pt also notes RUQ pain.  + diarrhea, 20, loose stool in the past 2 days.  Pt denies fever, chills, constipation, brbpr, black stool.  Pt presented to ED for evaluation.    Hospital Course:   Intractable nausea and vomiting-resolved, likely viral gastroenteritis.  Patient does have history of hyperemesis cannabinoid syndrome.  Intermittent diarrhea-patient has history of abdominal pain/intermittent diarrhea for a long time.  She was started on Bactrim for prevention of recurrent UTIs almost a month ago.  We will start her on Florastor 250 mg twice a day as long as she is on chronic antibiotic therapy.  C. difficile PCR is negative.  Abdominal pain, chronic-stable, lipase 16.  Abdominal ultrasound was negative for any acute abnormality.  Patient can follow-up with PCP and outpatient GI evaluation.  Continue sucralfate, Protonix.  Hypokalemia-potassium was replaced.    Procedures:  None  Consultations:  None  Discharge Exam: Vitals:   04/25/17 2001 04/26/17 0545  BP: (!) 129/91 123/88  Pulse: 63 67  Resp: 18 18  Temp: 97.9 F (36.6 C) 99.2 F (37.3 C)  SpO2: 99% 99%    General: Appears in no acute distress Cardiovascular: S1-S2, regular Respiratory:  Clear to auscultation bilaterally. Abdomen-soft, nontender, no organomegaly.  Thank you  Discharge Instructions   Discharge Instructions    Diet - low sodium heart healthy    Complete by:  As directed    Increase activity slowly    Complete by:  As directed      Current Discharge Medication List    START taking these medications   Details  saccharomyces boulardii (FLORASTOR) 250 MG capsule Take 1 capsule (250 mg total) by mouth 2 (two) times daily. Qty: 60 capsule, Refills: 0      CONTINUE these medications which have NOT CHANGED   Details  FLUoxetine (PROZAC) 20 MG capsule Take 20 mg by mouth daily.    levonorgestrel-ethinyl estradiol (NORDETTE) 0.15-30 MG-MCG tablet Take 1 tablet by mouth daily.    pantoprazole (PROTONIX) 40 MG tablet Take 1 tablet (40 mg total) by mouth daily. Qty: 30 tablet, Refills: 0    sulfamethoxazole-trimethoprim (BACTRIM,SEPTRA) 400-80 MG tablet Take 1 tablet by mouth 2 (two) times daily.    ondansetron (ZOFRAN) 4 MG tablet Take 1 tablet (4 mg total) by mouth every 8 (eight) hours as needed for nausea or vomiting. Qty: 12 tablet, Refills: 0    sucralfate (CARAFATE) 1 GM/10ML suspension Take 10 mLs (1 g total) by mouth 4 (four) times daily -  with meals and at bedtime. Qty: 420 mL, Refills: 0      STOP taking these medications     etonogestrel (NEXPLANON) 68 MG IMPL implant      amoxicillin (AMOXIL) 500 MG capsule      ondansetron (ZOFRAN  ODT) 4 MG disintegrating tablet        Allergies  Allergen Reactions  . Cinnamon   . Nickel Rash    Not specified       The results of significant diagnostics from this hospitalization (including imaging, microbiology, ancillary and laboratory) are listed below for reference.    Significant Diagnostic Studies: US Abdomen Limited Ruq  Result Date: 04/26/2017 CLINICAL DATA:  Abdominal pain for 4 days. Nausea, vomiting, and diarrhea. EXAM: ULTRASOUND ABDOMEN LIMITED RIGHT UPPER QUADRANT  COMPARISON:  None. FINDINGS: Gallbladder: No gallstones or wall thickening visualized. No sonographic Murphy sign noted by sonographer. Common bile duct: Diameter: 2 mm Liver: No focal lesion identified. Within normal limits in parenchymal echogenicity. Portal vein is patent on color Doppler imaging with normal direction of blood flow towards the liver. IMPRESSION: Normal exam. Electronically Signed   By: Marnee Spring M.D.   On: 04/26/2017 11:29    Microbiology: Recent Results (from the past 240 hour(s))  C difficile quick scan w PCR reflex     Status: None   Collection Time: 04/25/17 10:22 AM  Result Value Ref Range Status   C Diff antigen NEGATIVE NEGATIVE Final   C Diff toxin NEGATIVE NEGATIVE Final   C Diff interpretation No C. difficile detected.  Final    Comment: Performed at [facility], [address], [place], [place] [address]     Labs: Basic Metabolic Panel:  Recent Labs Lab 04/25/17 0807 04/25/17 0821 04/26/17 0414  NA 139 142 137  K 3.4* 3.5 3.5  CL 110 108 109  CO2 20*  --  20*  GLUCOSE 120* 120* 99  BUN 6 6 <5*  CREATININE 0.70 0.50 0.50  CALCIUM 9.0  --  8.3*   Liver Function Tests:  Recent Labs Lab 04/25/17 0807 04/26/17 0414  AST 32 40  ALT 17 26  ALKPHOS 45 37*  BILITOT 0.8 0.8  PROT 6.3* 5.7*  ALBUMIN 4.2 3.6    Recent Labs Lab 04/25/17 0807  LIPASE 16   No results for input(s): AMMONIA in the last 168 hours. CBC:  Recent Labs Lab 04/25/17 0807 04/25/17 0821 04/26/17 0414  WBC 5.3  --  6.8  NEUTROABS 4.5  --   --   HGB 12.9 12.9 12.3  HCT 37.6 38.0 35.3*  MCV 92.4  --  92.4  PLT 177  --  168   Cardiac Enzymes: No results for input(s): CKTOTAL, CKMB, CKMBINDEX, TROPONINI in the last 168 hours. BNP: BNP (last 3 results) No results for input(s): BNP in the last 8760 hours.  ProBNP (last 3 results) No results for input(s): PROBNP in the last 8760 hours.  CBG: No results for input(s): GLUCAP in the last 168  hours.     SignedMeredeth Ide MD.  [facility] 04/26/2017, 12:16 PM

## 2017-04-26 NOTE — Progress Notes (Signed)
Patient received D/C instructions and agreed to the medication change / regimen. Patient D/C home with mom. Patient walked out of facility with no assistance needed.

## 2017-04-30 LAB — NOROVIRUS GROUP 1 & 2 BY PCR, STOOL
NOROVIRUS 1 BY PCR: NEGATIVE
NOROVIRUS 2 BY PCR: NEGATIVE

## 2017-05-29 ENCOUNTER — Encounter (HOSPITAL_BASED_OUTPATIENT_CLINIC_OR_DEPARTMENT_OTHER): Payer: Self-pay | Admitting: *Deleted

## 2017-05-29 ENCOUNTER — Other Ambulatory Visit: Payer: Self-pay

## 2017-05-29 ENCOUNTER — Ambulatory Visit (HOSPITAL_COMMUNITY)
Admission: EM | Admit: 2017-05-29 | Discharge: 2017-05-29 | Discharge status: Against Medical Advice | Payer: Medicaid Other

## 2017-05-29 ENCOUNTER — Emergency Department (HOSPITAL_BASED_OUTPATIENT_CLINIC_OR_DEPARTMENT_OTHER)
Admission: EM | Admit: 2017-05-29 | Discharge: 2017-05-29 | Disposition: A | Discharge status: Home / Self Care | Payer: Medicaid Other | Attending: Emergency Medicine | Admitting: Emergency Medicine

## 2017-05-29 DIAGNOSIS — F17210 Nicotine dependence, cigarettes, uncomplicated: Secondary | ICD-10-CM | POA: Insufficient documentation

## 2017-05-29 DIAGNOSIS — H0014 Chalazion left upper eyelid: Secondary | ICD-10-CM | POA: Diagnosis not present

## 2017-05-29 DIAGNOSIS — H02844 Edema of left upper eyelid: Secondary | ICD-10-CM | POA: Diagnosis present

## 2017-05-29 DIAGNOSIS — Z79899 Other long term (current) drug therapy: Secondary | ICD-10-CM | POA: Diagnosis not present

## 2017-05-29 NOTE — ED Triage Notes (Signed)
Knot in her left upper eyelid. No hx of chalazion.

## 2017-05-29 NOTE — ED Notes (Signed)
Pt left the dept per patient intake.

## 2017-05-29 NOTE — ED Provider Notes (Signed)
[facility] EMERGENCY DEPARTMENT Provider Note   CSN: [id number] Arrival date & time: 05/29/17  1436     History   Chief Complaint Chief Complaint  Patient presents with  . Eye Problem    HPI Tiffany Williamson is a 21 y.o. female resents with an area of swelling and redness noted to the left upper eyelid times 1 week.  Patient reports the area has become progressively worse.  She states that she has been applying warm compresses with minimal improvement.  Patient states that she is still able to see without any difficulties.  She denies any drainage from the eye, eye redness, eye pain, fevers, facial redness or swelling.  The history is provided by the patient.    Past Medical History:  Diagnosis Date  . Chronic UTI   . Migraine     Patient Active Problem List   Diagnosis Date Noted  . Nausea & vomiting 04/25/2017  . Vomiting 10/28/2016  . Intractable vomiting 10/28/2016  . Depression 10/28/2016  . Persistent proteinuria 10/28/2016  . Abdominal pain 10/28/2016  . Total bilirubin, elevated 10/28/2016  . Hypokalemia 10/28/2016    History reviewed. No pertinent surgical history.  OB History    No data available       Home Medications    Prior to Admission medications   Medication Sig Start Date End Date Taking? Authorizing Provider  FLUoxetine (PROZAC) 20 MG capsule Take 20 mg by mouth daily.    [provider]  levonorgestrel-ethinyl estradiol (NORDETTE) 0.15-30 MG-MCG tablet Take 1 tablet by mouth daily.    [provider]  ondansetron (ZOFRAN) 4 MG tablet Take 1 tablet (4 mg total) by mouth every 8 (eight) hours as needed for nausea or vomiting. 10/15/16   Tegeler, Canary Brimhristopher J, MD  pantoprazole (PROTONIX) 40 MG tablet Take 1 tablet (40 mg total) by mouth daily. 10/30/16   Arnetha CourserAmin, Sumayya, MD  saccharomyces boulardii (FLORASTOR) 250 MG capsule Take 1 capsule (250 mg total) by mouth 2 (two) times daily. 04/26/17   Meredeth IdeLama, Gagan S, MD    sucralfate (CARAFATE) 1 GM/10ML suspension Take 10 mLs (1 g total) by mouth 4 (four) times daily -  with meals and at bedtime. 10/29/16   Arnetha CourserAmin, Sumayya, MD  sulfamethoxazole-trimethoprim (BACTRIM,SEPTRA) 400-80 MG tablet Take 1 tablet by mouth 2 (two) times daily.    [provider]    Family History No family history on file.  Social History Social History   Tobacco Use  . Smoking status: Current Every Day Smoker    Packs/day: 0.50    Types: Cigarettes  . Smokeless tobacco: Never Used  Substance Use Topics  . Alcohol use: No  . Drug use: Yes    Types: Marijuana     Allergies   Cinnamon and Nickel   Review of Systems Review of Systems  Constitutional: Negative for fatigue.  HENT: Negative for facial swelling.   Eyes: Positive for pain and redness. Negative for visual disturbance.     Physical Exam Updated Vital Signs BP 112/75 (BP Location: Left Arm)   Pulse 92   Temp 98.5 F (36.9 C) (Oral)   Resp 16   Ht 5\' 3"  (1.6 m)   Wt 46.7 kg (103 lb)   SpO2 99%   BMI 18.25 kg/m   Physical Exam  Constitutional: She appears well-developed and well-nourished.  HENT:  Head: Normocephalic and atraumatic.  Eyes: Conjunctivae and EOM are normal. Pupils are equal, round, and reactive to light. Right eye  exhibits no discharge. Left eye exhibits no discharge. Right conjunctiva is not injected. Left conjunctiva is not injected. No scleral icterus.    Limbs intact without difficulty.  No periorbital tenderness palpation bilaterally.  No overlying warmth, erythema.  Pulmonary/Chest: Effort normal.  Neurological: She is alert.  Skin: Skin is warm and dry.  Psychiatric: She has a normal mood and affect. Her speech is normal and behavior is normal.  Nursing note and vitals reviewed.    ED Treatments / Results  Labs (all labs ordered are listed, but only abnormal results are displayed) Labs Reviewed - No data to display  EKG  EKG Interpretation None        Radiology No results found.  Procedures Procedures (including critical care time)  Medications Ordered in ED Medications - No data to display   Initial Impression / Assessment and Plan / ED Course  I have reviewed the triage vital signs and the nursing notes.  Pertinent labs & imaging results that were available during my care of the patient were reviewed by me and considered in my medical decision making (see chart for details).     21 year old female who presents with 1 week of left upper eyelid swelling and redness.  No facial pain swelling, fevers. Patient is afebrile, non-toxic appearing, sitting comfortably on examination table. Vital signs reviewed and stable.  Physical exam is consistent with a chalazion of the left upper eyelid.  History/physical exam is not concerning for preseptal cellulitis.  Given that this is been ongoing for a week, will plan to give ophthalmology referral for potential I&D if symptoms do not improve. Patient had ample opportunity for questions and discussion. All patient's questions were answered with full understanding. Strict return precautions discussed. Patient expresses understanding and agreement to plan.    Final Clinical Impressions(s) / ED Diagnoses   Final diagnoses:  Chalazion of left upper eyelid    ED Discharge Orders    None       Rosana HoesLayden, Arbie Reisz A, PA-C 05/29/17 1737    Charlynne PanderYao, David Hsienta, MD 05/29/17 2329

## 2017-05-29 NOTE — Discharge Instructions (Signed)
As we discussed, apply warm compresses to the area.  Follow-up with referred ophthalmology doctor if symptoms do not improve within the next week.  Return to the emergency department for any worsening redness, swelling, vision changes, fevers, facial swelling or redness or any other worsening or concerning symptoms.

## 2017-06-02 ENCOUNTER — Other Ambulatory Visit: Payer: Self-pay

## 2017-06-02 ENCOUNTER — Encounter (HOSPITAL_BASED_OUTPATIENT_CLINIC_OR_DEPARTMENT_OTHER): Payer: Self-pay

## 2017-06-02 ENCOUNTER — Emergency Department (HOSPITAL_BASED_OUTPATIENT_CLINIC_OR_DEPARTMENT_OTHER)
Admission: EM | Admit: 2017-06-02 | Discharge: 2017-06-02 | Disposition: A | Discharge status: Home / Self Care | Payer: Medicaid Other | Attending: Emergency Medicine | Admitting: Emergency Medicine

## 2017-06-02 DIAGNOSIS — R112 Nausea with vomiting, unspecified: Secondary | ICD-10-CM | POA: Diagnosis present

## 2017-06-02 DIAGNOSIS — F17210 Nicotine dependence, cigarettes, uncomplicated: Secondary | ICD-10-CM | POA: Diagnosis not present

## 2017-06-02 DIAGNOSIS — Z79899 Other long term (current) drug therapy: Secondary | ICD-10-CM | POA: Diagnosis not present

## 2017-06-02 LAB — COMPREHENSIVE METABOLIC PANEL
ALBUMIN: 4.5 g/dL (ref 3.5–5.0)
ALT: 13 U/L — ABNORMAL LOW (ref 14–54)
AST: 26 U/L (ref 15–41)
Alkaline Phosphatase: 54 U/L (ref 38–126)
Anion gap: 10 (ref 5–15)
BILIRUBIN TOTAL: 0.9 mg/dL (ref 0.3–1.2)
BUN: 8 mg/dL (ref 6–20)
CHLORIDE: 109 mmol/L (ref 101–111)
CO2: 19 mmol/L — ABNORMAL LOW (ref 22–32)
CREATININE: 0.57 mg/dL (ref 0.44–1.00)
Calcium: 9 mg/dL (ref 8.9–10.3)
GFR calc Af Amer: >60 mL/min (ref >60)
GLUCOSE: 127 mg/dL — AB (ref 65–99)
Potassium: 4.1 mmol/L (ref 3.5–5.1)
Sodium: 138 mmol/L (ref 135–145)
TOTAL PROTEIN: 6.9 g/dL (ref 6.5–8.1)

## 2017-06-02 LAB — CBC WITH DIFFERENTIAL/PLATELET
Basophils Absolute: 0 10*3/uL (ref 0.0–0.1)
Basophils Relative: 0 %
EOS ABS: 0.1 10*3/uL (ref 0.0–0.7)
EOS PCT: 0 %
HCT: 40.5 % (ref 36.0–46.0)
Hemoglobin: 14.3 g/dL (ref 12.0–15.0)
LYMPHS ABS: 2.4 10*3/uL (ref 0.7–4.0)
LYMPHS PCT: 15 %
MCH: 32.3 pg (ref 26.0–34.0)
MCHC: 35.3 g/dL (ref 30.0–36.0)
MCV: 91.4 fL (ref 78.0–100.0)
MONO ABS: 1 10*3/uL (ref 0.1–1.0)
Monocytes Relative: 6 %
Neutro Abs: 13.1 10*3/uL — ABNORMAL HIGH (ref 1.7–7.7)
Neutrophils Relative %: 79 %
PLATELETS: 245 10*3/uL (ref 150–400)
RBC: 4.43 MIL/uL (ref 3.87–5.11)
RDW: 12.3 % (ref 11.5–15.5)
WBC: 16.6 10*3/uL — AB (ref 4.0–10.5)

## 2017-06-02 LAB — URINALYSIS, ROUTINE W REFLEX MICROSCOPIC
BILIRUBIN URINE: NEGATIVE
GLUCOSE, UA: NEGATIVE mg/dL
KETONES UR: 15 mg/dL — AB
Nitrite: NEGATIVE
PH: 6.5 (ref 5.0–8.0)
PROTEIN: NEGATIVE mg/dL
Specific Gravity, Urine: 1.02 (ref 1.005–1.030)

## 2017-06-02 LAB — URINALYSIS, MICROSCOPIC (REFLEX)

## 2017-06-02 LAB — PREGNANCY, URINE: PREG TEST UR: NEGATIVE

## 2017-06-02 MED ORDER — PROMETHAZINE HCL 25 MG PO TABS: 25.0000 mg | ORAL_TABLET | Freq: Four times a day (QID) | ORAL | 0 refills | Status: DC | PRN

## 2017-06-02 MED ORDER — SODIUM CHLORIDE 0.9 % IV BOLUS (SEPSIS)
1000.0000 mL | Freq: Once | INTRAVENOUS | Status: AC
  Administered 2017-06-02: 1000 mL via INTRAVENOUS

## 2017-06-02 MED ORDER — CAPSAICIN 0.025 % EX CREA
TOPICAL_CREAM | Freq: Once | CUTANEOUS | Status: AC
  Administered 2017-06-02: 15:00:00 via TOPICAL
  Filled 2017-06-02 (×2): qty 60

## 2017-06-02 MED ORDER — DICYCLOMINE HCL 10 MG/ML IM SOLN
20.0000 mg | Freq: Once | INTRAMUSCULAR | Status: AC
  Administered 2017-06-02: 20 mg via INTRAMUSCULAR
  Filled 2017-06-02: qty 2

## 2017-06-02 MED ORDER — PROMETHAZINE HCL 25 MG/ML IJ SOLN
25.0000 mg | Freq: Once | INTRAMUSCULAR | Status: AC
  Administered 2017-06-02: 25 mg via INTRAVENOUS
  Filled 2017-06-02: qty 1

## 2017-06-02 MED ORDER — HALOPERIDOL LACTATE 5 MG/ML IJ SOLN
2.0000 mg | Freq: Once | INTRAMUSCULAR | Status: AC
  Administered 2017-06-02: 2 mg via INTRAVENOUS
  Filled 2017-06-02: qty 1

## 2017-06-02 MED ORDER — ONDANSETRON HCL 4 MG/2ML IJ SOLN
4.0000 mg | Freq: Once | INTRAMUSCULAR | Status: AC
  Administered 2017-06-02: 4 mg via INTRAVENOUS
  Filled 2017-06-02: qty 2

## 2017-06-02 MED ORDER — SODIUM CHLORIDE 0.9 % IV BOLUS (SEPSIS)
1000.0000 mL | Freq: Once | INTRAVENOUS | Status: AC
Start: 2017-06-02 — End: 2017-06-02
  Administered 2017-06-02: 1000 mL via INTRAVENOUS

## 2017-06-02 NOTE — ED Notes (Signed)
Pt states she is still vomiting. Pt presented emesis bag that only had saliva in it. Pt drinking ginger ale at this time. Will reevaluate after PO challenge.

## 2017-06-02 NOTE — ED Notes (Signed)
Pt states she is still throwing up, but overall she feels she is improving a slightly

## 2017-06-02 NOTE — Discharge Instructions (Signed)
Please avoid marijuana use as it may worsen your condition.  Stay hydrated.

## 2017-06-02 NOTE — ED Notes (Signed)
Pt in the restroom.

## 2017-06-02 NOTE — ED Triage Notes (Addendum)
C/o n/v/d x today-pt later stated she drank a large amount of alcohol last night

## 2017-06-02 NOTE — ED Provider Notes (Signed)
Received pt at sign out.  Pt with sxs suggestive of cannabinoid hyperemesis syndrome.  Admits to using marijuana daily.  Had persistent nausea and vomiting with abdominal discomfort.  sxs subside with treatment. She's able to urinate, and tolerates ice chips.  Nausea improves.  Recommend cessation of marijuana.  Return precaution given.   BP (!) 127/93   Pulse 69   Resp (!) 22   Ht 5\' 3"  (1.6 m)   Wt 46.7 kg (103 lb)   LMP 05/19/2017   SpO2 100%   BMI 18.25 kg/m   Results for orders placed or performed during the hospital encounter of 06/02/17  Comprehensive metabolic panel  Result Value Ref Range   Sodium 138 135 - 145 mmol/L   Potassium 4.1 3.5 - 5.1 mmol/L   Chloride 109 101 - 111 mmol/L   CO2 19 (L) 22 - 32 mmol/L   Glucose, Bld 127 (H) 65 - 99 mg/dL   BUN 8 6 - 20 mg/dL   Creatinine, Ser 1.610.57 0.44 - 1.00 mg/dL   Calcium 9.0 8.9 - 09.610.3 mg/dL   Total Protein 6.9 6.5 - 8.1 g/dL   Albumin 4.5 3.5 - 5.0 g/dL   AST 26 15 - 41 U/L   ALT 13 (L) 14 - 54 U/L   Alkaline Phosphatase 54 38 - 126 U/L   Total Bilirubin 0.9 0.3 - 1.2 mg/dL   GFR calc non Af Amer >60 >60 mL/min   GFR calc Af Amer >60 >60 mL/min   Anion gap 10 5 - 15  CBC with Differential  Result Value Ref Range   WBC 16.6 (H) 4.0 - 10.5 K/uL   RBC 4.43 3.87 - 5.11 MIL/uL   Hemoglobin 14.3 12.0 - 15.0 g/dL   HCT 04.540.5 40.936.0 - 81.146.0 %   MCV 91.4 78.0 - 100.0 fL   MCH 32.3 26.0 - 34.0 pg   MCHC 35.3 30.0 - 36.0 g/dL   RDW 91.412.3 78.211.5 - 95.615.5 %   Platelets 245 150 - 400 K/uL   Neutrophils Relative % 79 %   Neutro Abs 13.1 (H) 1.7 - 7.7 K/uL   Lymphocytes Relative 15 %   Lymphs Abs 2.4 0.7 - 4.0 K/uL   Monocytes Relative 6 %   Monocytes Absolute 1.0 0.1 - 1.0 K/uL   Eosinophils Relative 0 %   Eosinophils Absolute 0.1 0.0 - 0.7 K/uL   Basophils Relative 0 %   Basophils Absolute 0.0 0.0 - 0.1 K/uL  Pregnancy, urine  Result Value Ref Range   Preg Test, Ur NEGATIVE NEGATIVE  Urinalysis, Routine w reflex microscopic   Result Value Ref Range   Color, Urine YELLOW YELLOW   APPearance CLOUDY (A) CLEAR   Specific Gravity, Urine 1.020 1.005 - 1.030   pH 6.5 5.0 - 8.0   Glucose, UA NEGATIVE NEGATIVE mg/dL   Hgb urine dipstick SMALL (A) NEGATIVE   Bilirubin Urine NEGATIVE NEGATIVE   Ketones, ur 15 (A) NEGATIVE mg/dL   Protein, ur NEGATIVE NEGATIVE mg/dL   Nitrite NEGATIVE NEGATIVE   Leukocytes, UA TRACE (A) NEGATIVE  Urinalysis, Microscopic (reflex)  Result Value Ref Range   RBC / HPF 0-5 0 - 5 RBC/hpf   WBC, UA 0-5 0 - 5 WBC/hpf   Bacteria, UA FEW (A) NONE SEEN   Squamous Epithelial / LPF 6-30 (A) NONE SEEN   Mucus PRESENT    No results found.    Fayrene Helperran, Wayne Brunker, PA-C 06/02/17 1816    Maia PlanLong, Joshua G, MD 06/02/17 [phone]

## 2017-06-02 NOTE — ED Provider Notes (Signed)
[facility] EMERGENCY DEPARTMENT Provider Note   CSN: [id number] Arrival date & time: 06/02/17  1237     History   Chief Complaint Chief Complaint  Patient presents with  . Emesis    HPI Tiffany Williamson is a 21 y.o. female.  The history is provided by the patient and medical records. No language interpreter was used.  Emesis   Associated symptoms include abdominal pain.   Tiffany Williamson is a 21 y.o. female  with a PMH of chronic UTI's, cannabinoid hyperemesis syndrome who presents to the Emergency Department complaining of nausea and vomiting which began this morning. Generalized abdominal pain. No diarrhea, constipation, fever, chills or urinary symptoms. Patient states that she drank more than usual last night and attributes symptoms to this. She also does endorses daily marijuana use. No medications taken prior to arrival for her symptoms. Denies recent travel, camping, etc. No known sick contacts with similar.   Past Medical History:  Diagnosis Date  . Chronic UTI   . Migraine     Patient Active Problem List   Diagnosis Date Noted  . Nausea & vomiting 04/25/2017  . Vomiting 10/28/2016  . Intractable vomiting 10/28/2016  . Depression 10/28/2016  . Persistent proteinuria 10/28/2016  . Abdominal pain 10/28/2016  . Total bilirubin, elevated 10/28/2016  . Hypokalemia 10/28/2016    History reviewed. No pertinent surgical history.  OB History    No data available       Home Medications    Prior to Admission medications   Medication Sig Start Date End Date Taking? Authorizing Provider  FLUoxetine (PROZAC) 20 MG capsule Take 20 mg by mouth daily.    [provider]  levonorgestrel-ethinyl estradiol (NORDETTE) 0.15-30 MG-MCG tablet Take 1 tablet by mouth daily.    [provider]  ondansetron (ZOFRAN) 4 MG tablet Take 1 tablet (4 mg total) by mouth every 8 (eight) hours as needed for nausea or vomiting. 10/15/16   Tegeler, Canary Brimhristopher J,  MD  pantoprazole (PROTONIX) 40 MG tablet Take 1 tablet (40 mg total) by mouth daily. 10/30/16   Arnetha CourserAmin, Sumayya, MD  saccharomyces boulardii (FLORASTOR) 250 MG capsule Take 1 capsule (250 mg total) by mouth 2 (two) times daily. 04/26/17   Meredeth IdeLama, Gagan S, MD  sucralfate (CARAFATE) 1 GM/10ML suspension Take 10 mLs (1 g total) by mouth 4 (four) times daily -  with meals and at bedtime. 10/29/16   Arnetha CourserAmin, Sumayya, MD  sulfamethoxazole-trimethoprim (BACTRIM,SEPTRA) 400-80 MG tablet Take 1 tablet by mouth 2 (two) times daily.    [provider]    Family History No family history on file.  Social History Social History   Tobacco Use  . Smoking status: Current Every Day Smoker    Packs/day: 0.50    Types: Cigarettes  . Smokeless tobacco: Never Used  Substance Use Topics  . Alcohol use: Yes    Comment: occ  . Drug use: Yes    Types: Marijuana     Allergies   Cinnamon and Nickel   Review of Systems Review of Systems  Gastrointestinal: Positive for abdominal pain, nausea and vomiting.  All other systems reviewed and are negative.   Physical Exam Updated Vital Signs BP (!) 115/99 (BP Location: Right Arm)   Pulse 88   Resp 20   Ht 5\' 3"  (1.6 m)   Wt 46.7 kg (103 lb)   LMP 05/19/2017   SpO2 98%   BMI 18.25 kg/m   Physical Exam  Constitutional: She is  oriented to person, place, and time. She appears well-developed and well-nourished. No distress.  HENT:  Head: Normocephalic and atraumatic.  Cardiovascular: Normal rate, regular rhythm and normal heart sounds.  No murmur heard. Pulmonary/Chest: Effort normal and breath sounds normal. No respiratory distress.  Abdominal: Soft. She exhibits no distension. There is tenderness.  Diffuse abdominal tenderness with no rebound or guarding. Negative Murphy's. No focal tenderness at McBurney's.  Musculoskeletal: She exhibits no edema.  Neurological: She is alert and oriented to person, place, and time.  Skin: Skin is warm and dry.    Nursing note and vitals reviewed.    ED Treatments / Results  Labs (all labs ordered are listed, but only abnormal results are displayed) Labs Reviewed  COMPREHENSIVE METABOLIC PANEL - Abnormal; Notable for the following components:      Result Value   CO2 19 (*)    Glucose, Bld 127 (*)    ALT 13 (*)    All other components within normal limits  CBC WITH DIFFERENTIAL/PLATELET - Abnormal; Notable for the following components:   WBC 16.6 (*)    Neutro Abs 13.1 (*)    All other components within normal limits  PREGNANCY, URINE  URINALYSIS, ROUTINE W REFLEX MICROSCOPIC    EKG  EKG Interpretation None       Radiology No results found.  Procedures Procedures (including critical care time)  Medications Ordered in ED Medications  sodium chloride 0.9 % bolus 1,000 mL (0 mLs Intravenous Stopped 06/02/17 1508)  ondansetron (ZOFRAN) injection 4 mg (4 mg Intravenous Given 06/02/17 1318)  dicyclomine (BENTYL) injection 20 mg (20 mg Intramuscular Given 06/02/17 1324)  promethazine (PHENERGAN) injection 25 mg (25 mg Intravenous Given 06/02/17 1440)  capsaicin (ZOSTRIX) 0.025 % cream ( Topical Given 06/02/17 1439)  sodium chloride 0.9 % bolus 1,000 mL (0 mLs Intravenous Stopped 06/02/17 1603)  haloperidol lactate (HALDOL) injection 2 mg (2 mg Intravenous Given 06/02/17 1600)     Initial Impression / Assessment and Plan / ED Course  I have reviewed the triage vital signs and the nursing notes.  Pertinent labs & imaging results that were available during my care of the patient were reviewed by me and considered in my medical decision making (see chart for details).    Tiffany Williamson is a 21 y.o. female who presents to ED for nausea, vomiting and generalized abdominal pain which began this morning. Hx of similar thought to be cannabinoid induced. She does report continued daily marijuana use to me today. Labs reviewed: leukocytosis of 16.6. No fever, chills. Has had leukocytosis up to 17  in the past with similar presentation and negative CT abd/pelvis. Non-surgical abdomen. Fluids given. Antipyretics given. Patient re-evaluated and not feeling much better. Capsaicin cream, haldol given. UA and Upreg pending at shift change. Care assumed by oncoming provider PA Laveda Normanran. Case discussed, plan agreed upon. Will follow up on pending ua and upreg, po challenge, re-evaluate patient and disposition appropriately.     Final Clinical Impressions(s) / ED Diagnoses   Final diagnoses:  None    ED Discharge Orders    None       Ward, Chase PicketJaime Pilcher, PA-C 06/02/17 1631    Nira Connardama, Pedro Eduardo, MD 06/03/17 [phone]

## 2017-06-02 NOTE — ED Notes (Signed)
Pt noted to have a rash on abd from capsaicin cream. Area washed clean with wash cloth. EDP notified.

## 2017-06-27 ENCOUNTER — Other Ambulatory Visit: Payer: Self-pay

## 2017-06-27 ENCOUNTER — Emergency Department (HOSPITAL_BASED_OUTPATIENT_CLINIC_OR_DEPARTMENT_OTHER)
Admission: EM | Admit: 2017-06-27 | Discharge: 2017-06-27 | Disposition: A | Discharge status: Home / Self Care | Payer: Medicaid Other | Attending: Emergency Medicine | Admitting: Emergency Medicine

## 2017-06-27 ENCOUNTER — Encounter (HOSPITAL_BASED_OUTPATIENT_CLINIC_OR_DEPARTMENT_OTHER): Payer: Self-pay | Admitting: *Deleted

## 2017-06-27 DIAGNOSIS — R112 Nausea with vomiting, unspecified: Secondary | ICD-10-CM

## 2017-06-27 DIAGNOSIS — E860 Dehydration: Secondary | ICD-10-CM | POA: Insufficient documentation

## 2017-06-27 DIAGNOSIS — F17210 Nicotine dependence, cigarettes, uncomplicated: Secondary | ICD-10-CM | POA: Insufficient documentation

## 2017-06-27 DIAGNOSIS — Z79899 Other long term (current) drug therapy: Secondary | ICD-10-CM | POA: Insufficient documentation

## 2017-06-27 LAB — CBC WITH DIFFERENTIAL/PLATELET
Basophils Absolute: 0 10*3/uL (ref 0.0–0.1)
Basophils Relative: 0 %
EOS ABS: 0 10*3/uL (ref 0.0–0.7)
EOS PCT: 0 %
HCT: 43.6 % (ref 36.0–46.0)
Hemoglobin: 15.6 g/dL — ABNORMAL HIGH (ref 12.0–15.0)
LYMPHS ABS: 1.6 10*3/uL (ref 0.7–4.0)
LYMPHS PCT: 7 %
MCH: 32.2 pg (ref 26.0–34.0)
MCHC: 35.8 g/dL (ref 30.0–36.0)
MCV: 89.9 fL (ref 78.0–100.0)
MONOS PCT: 5 %
Monocytes Absolute: 1.1 10*3/uL — ABNORMAL HIGH (ref 0.1–1.0)
NEUTROS PCT: 88 %
Neutro Abs: 20.9 10*3/uL — ABNORMAL HIGH (ref 1.7–7.7)
Platelets: 290 10*3/uL (ref 150–400)
RBC: 4.85 MIL/uL (ref 3.87–5.11)
RDW: 12.1 % (ref 11.5–15.5)
WBC: 23.7 10*3/uL — AB (ref 4.0–10.5)

## 2017-06-27 LAB — COMPREHENSIVE METABOLIC PANEL
ALK PHOS: 57 U/L (ref 38–126)
ALT: 18 U/L (ref 14–54)
ANION GAP: 18 — AB (ref 5–15)
AST: 41 U/L (ref 15–41)
Albumin: 5.4 g/dL — ABNORMAL HIGH (ref 3.5–5.0)
BUN: 10 mg/dL (ref 6–20)
CALCIUM: 9.7 mg/dL (ref 8.9–10.3)
CO2: 16 mmol/L — ABNORMAL LOW (ref 22–32)
CREATININE: 0.77 mg/dL (ref 0.44–1.00)
Chloride: 106 mmol/L (ref 101–111)
Glucose, Bld: 129 mg/dL — ABNORMAL HIGH (ref 65–99)
Potassium: 3.2 mmol/L — ABNORMAL LOW (ref 3.5–5.1)
Sodium: 140 mmol/L (ref 135–145)
TOTAL PROTEIN: 8.4 g/dL — AB (ref 6.5–8.1)
Total Bilirubin: 0.8 mg/dL (ref 0.3–1.2)

## 2017-06-27 LAB — LIPASE, BLOOD: LIPASE: 22 U/L (ref 11–51)

## 2017-06-27 LAB — PREGNANCY, URINE: PREG TEST UR: NEGATIVE

## 2017-06-27 MED ORDER — FAMOTIDINE IN NACL 20-0.9 MG/50ML-% IV SOLN
20.0000 mg | Freq: Once | INTRAVENOUS | Status: AC
  Administered 2017-06-27: 20 mg via INTRAVENOUS
  Filled 2017-06-27: qty 50

## 2017-06-27 MED ORDER — HALOPERIDOL LACTATE 5 MG/ML IJ SOLN
5.0000 mg | Freq: Once | INTRAMUSCULAR | Status: AC
  Administered 2017-06-27: 5 mg via INTRAVENOUS
  Filled 2017-06-27: qty 1

## 2017-06-27 MED ORDER — SODIUM CHLORIDE 0.9 % IV BOLUS (SEPSIS)
2000.0000 mL | Freq: Once | INTRAVENOUS | Status: AC
  Administered 2017-06-27: 2000 mL via INTRAVENOUS

## 2017-06-27 MED ORDER — CHLORPROMAZINE HCL 25 MG PO TABS: 25.0000 mg | ORAL_TABLET | Freq: Three times a day (TID) | ORAL | 0 refills | Status: DC | PRN

## 2017-06-27 MED ORDER — PROMETHAZINE HCL 25 MG RE SUPP: 25.0000 mg | Freq: Four times a day (QID) | RECTAL | 0 refills | Status: DC | PRN

## 2017-06-27 MED ORDER — LORAZEPAM 2 MG/ML IJ SOLN
1.0000 mg | Freq: Once | INTRAMUSCULAR | Status: AC
  Administered 2017-06-27: 1 mg via INTRAVENOUS
  Filled 2017-06-27: qty 1

## 2017-06-27 NOTE — ED Notes (Addendum)
She has vomited x 2 at triage. Small amounts of thin gastric contents with some lt brown possible blood streaks. She continues to stick her fingers down her throat.

## 2017-06-27 NOTE — ED Notes (Signed)
Pt is sticking her fingers down her throat to make herself vomit. Gagging.

## 2017-06-27 NOTE — ED Triage Notes (Signed)
Vomiting due to alcohol intake last night. Bright red streaks of blood in her vomitus.

## 2017-06-27 NOTE — ED Provider Notes (Signed)
[facility] EMERGENCY DEPARTMENT Provider Note   CSN: [id number] Arrival date & time: 06/27/17  1113     History   Chief Complaint Chief Complaint  Patient presents with  . Emesis    HPI Tiffany Williamson is a 22 y.o. female.  Patient is a 22 year old female with a history of depression, hypokalemia, marijuana induced intractable vomiting, migraine chronic chronic UTIs presenting today with nausea vomiting, diarrhea and epigastric pain that started at 7 AM this morning.  Patient states she vomited multiple times and now is seeing streaks of blood in her vomitus.  Patient has had these symptoms multiple times in the past but states today is different because the pain is localized in her epigastric area.  She had a endoscopy done approximately 1 year ago which showed some gastric irritation but they did not feel that she needed to be on a PPI at that time.  She does not take NSAIDs regularly.  She denies fever, cough or shortness of breath.  She did admit to the nurse that she drank alcohol last night but denies excessive alcohol use.  She has tried taking home meds without improvement.  She also continues to smoke cigarettes and marijuana.   The history is provided by the patient.    Past Medical History:  Diagnosis Date  . Chronic UTI   . Migraine     Patient Active Problem List   Diagnosis Date Noted  . Nausea & vomiting 04/25/2017  . Vomiting 10/28/2016  . Intractable vomiting 10/28/2016  . Depression 10/28/2016  . Persistent proteinuria 10/28/2016  . Abdominal pain 10/28/2016  . Total bilirubin, elevated 10/28/2016  . Hypokalemia 10/28/2016    History reviewed. No pertinent surgical history.  OB History    No data available       Home Medications    Prior to Admission medications   Medication Sig Start Date End Date Taking? Authorizing Provider  FLUoxetine (PROZAC) 20 MG capsule Take 20 mg by mouth daily.    [provider]    levonorgestrel-ethinyl estradiol (NORDETTE) 0.15-30 MG-MCG tablet Take 1 tablet by mouth daily.    [provider]  ondansetron (ZOFRAN) 4 MG tablet Take 1 tablet (4 mg total) by mouth every 8 (eight) hours as needed for nausea or vomiting. 10/15/16   Tegeler, Canary Brimhristopher J, MD  pantoprazole (PROTONIX) 40 MG tablet Take 1 tablet (40 mg total) by mouth daily. 10/30/16   Arnetha CourserAmin, Sumayya, MD  promethazine (PHENERGAN) 25 MG tablet Take 1 tablet (25 mg total) by mouth every 6 (six) hours as needed for nausea. 06/02/17   Fayrene Helperran, Bowie, PA-C  saccharomyces boulardii (FLORASTOR) 250 MG capsule Take 1 capsule (250 mg total) by mouth 2 (two) times daily. 04/26/17   Meredeth IdeLama, Gagan S, MD  sucralfate (CARAFATE) 1 GM/10ML suspension Take 10 mLs (1 g total) by mouth 4 (four) times daily -  with meals and at bedtime. 10/29/16   Arnetha CourserAmin, Sumayya, MD  sulfamethoxazole-trimethoprim (BACTRIM,SEPTRA) 400-80 MG tablet Take 1 tablet by mouth 2 (two) times daily.    [provider]    Family History No family history on file.  Social History Social History   Tobacco Use  . Smoking status: Current Every Day Smoker    Packs/day: 0.50    Types: Cigarettes  . Smokeless tobacco: Never Used  Substance Use Topics  . Alcohol use: Yes    Comment: occ  . Drug use: Yes    Types: Marijuana     Allergies  Cinnamon and Nickel   Review of Systems Review of Systems  All other systems reviewed and are negative.    Physical Exam Updated Vital Signs BP (!) 130/100   Pulse 77   Temp 98 F (36.7 C) (Oral)   Resp 16   Ht 5\' 3"  (1.6 m)   Wt 46.7 kg (103 lb)   LMP 06/13/2017   SpO2 100%   BMI 18.25 kg/m   Physical Exam  Constitutional: She is oriented to person, place, and time. She appears well-developed and well-nourished. No distress.  HENT:  Head: Normocephalic and atraumatic.  Mouth/Throat: Oropharynx is clear and moist. Mucous membranes are dry.  Eyes: Conjunctivae and EOM are normal. Pupils are  equal, round, and reactive to light.  Neck: Normal range of motion. Neck supple.  Cardiovascular: Normal rate, regular rhythm and intact distal pulses.  No murmur heard. Pulmonary/Chest: Effort normal and breath sounds normal. No respiratory distress. She has no wheezes. She has no rales.  Abdominal: Soft. She exhibits no distension. There is tenderness in the epigastric area. There is no rebound and no guarding.  Musculoskeletal: Normal range of motion. She exhibits no edema or tenderness.  Neurological: She is alert and oriented to person, place, and time.  Skin: Skin is warm and dry. No rash noted. No erythema.  Psychiatric: She has a normal mood and affect. Her behavior is normal.  Nursing note and vitals reviewed.    ED Treatments / Results  Labs (all labs ordered are listed, but only abnormal results are displayed) Labs Reviewed  CBC WITH DIFFERENTIAL/PLATELET - Abnormal; Notable for the following components:      Result Value   WBC 23.7 (*)    Hemoglobin 15.6 (*)    Neutro Abs 20.9 (*)    Monocytes Absolute 1.1 (*)    All other components within normal limits  COMPREHENSIVE METABOLIC PANEL - Abnormal; Notable for the following components:   Potassium 3.2 (*)    CO2 16 (*)    Glucose, Bld 129 (*)    Total Protein 8.4 (*)    Albumin 5.4 (*)    Anion gap 18 (*)    All other components within normal limits  LIPASE, BLOOD  PREGNANCY, URINE    EKG  EKG Interpretation None       Radiology No results found.  Procedures Procedures (including critical care time)  Medications Ordered in ED Medications  sodium chloride 0.9 % bolus 2,000 mL (not administered)  haloperidol lactate (HALDOL) injection 5 mg (not administered)  famotidine (PEPCID) IVPB 20 mg premix (not administered)     Initial Impression / Assessment and Plan / ED Course  I have reviewed the triage vital signs and the nursing notes.  Pertinent labs & imaging results that were available during my  care of the patient were reviewed by me and considered in my medical decision making (see chart for details).     Patient presenting today with intractable nausea and vomiting which could be related to cannabinoids but also could be related to recent alcohol intake.  Patient is mildly dehydrated on exam and having epigastric pain.  Low suspicion for appendicitis, diverticulitis or perforated viscus.  Patient was given Haldol, IV fluids and Pepcid.  CBC, CMP, lipase,  UPT pending.  3:29 PM Patient is feeling better after IV meds.  CBC with a leukocytosis of 23,000 which is most likely acute phase reaction.  Hemoglobin is within normal limits.  CMP with an anion gap of 18 but otherwise  no significant findings.  Lipase is within normal limits.  UPT is negative.  Patient denies any urinary complaints does not have fever or back pain concerning for Pilo.  A UA was not done at this time.  Patient keep 2 L of fluid and is tolerating p.o.'s.  Recommended she follow-up with PCP if symptoms continue.  Final Clinical Impressions(s) / ED Diagnoses   Final diagnoses:  Intractable vomiting with nausea, unspecified vomiting type  Dehydration    ED Discharge Orders        Ordered    chlorproMAZINE (THORAZINE) 25 MG tablet  3 times daily PRN     06/27/17 1532    promethazine (PHENERGAN) 25 MG suppository  Every 6 hours PRN     06/27/17 1532       Gwyneth Sprout, MD 06/27/17 1532

## 2017-06-27 NOTE — ED Notes (Signed)
Patient tolerating oral trial without any N/V

## 2017-06-27 NOTE — Discharge Instructions (Signed)
Also take 2 weeks of over the counter either pepcid or zantac to help with the stomach pain.

## 2017-08-13 ENCOUNTER — Other Ambulatory Visit: Payer: Self-pay

## 2017-08-13 ENCOUNTER — Encounter (HOSPITAL_BASED_OUTPATIENT_CLINIC_OR_DEPARTMENT_OTHER): Payer: Self-pay | Admitting: Emergency Medicine

## 2017-08-13 ENCOUNTER — Emergency Department (HOSPITAL_BASED_OUTPATIENT_CLINIC_OR_DEPARTMENT_OTHER)
Admission: EM | Admit: 2017-08-13 | Discharge: 2017-08-13 | Disposition: A | Discharge status: Home / Self Care | Payer: Medicaid Other | Attending: Emergency Medicine | Admitting: Emergency Medicine

## 2017-08-13 DIAGNOSIS — F329 Major depressive disorder, single episode, unspecified: Secondary | ICD-10-CM | POA: Insufficient documentation

## 2017-08-13 DIAGNOSIS — R112 Nausea with vomiting, unspecified: Secondary | ICD-10-CM | POA: Diagnosis present

## 2017-08-13 DIAGNOSIS — Z79899 Other long term (current) drug therapy: Secondary | ICD-10-CM | POA: Insufficient documentation

## 2017-08-13 DIAGNOSIS — F17210 Nicotine dependence, cigarettes, uncomplicated: Secondary | ICD-10-CM | POA: Diagnosis not present

## 2017-08-13 DIAGNOSIS — R197 Diarrhea, unspecified: Secondary | ICD-10-CM | POA: Diagnosis not present

## 2017-08-13 DIAGNOSIS — R109 Unspecified abdominal pain: Secondary | ICD-10-CM

## 2017-08-13 HISTORY — DX: Vomiting, unspecified: R11.10

## 2017-08-13 HISTORY — DX: Depression, unspecified: F32.A

## 2017-08-13 HISTORY — DX: Major depressive disorder, single episode, unspecified: F32.9

## 2017-08-13 MED ORDER — ONDANSETRON 4 MG PO TBDP: 4.0000 mg | ORAL_TABLET | Freq: Three times a day (TID) | ORAL | 0 refills | Status: DC | PRN

## 2017-08-13 MED ORDER — SODIUM CHLORIDE 0.9 % IV BOLUS (SEPSIS)
1000.0000 mL | Freq: Once | INTRAVENOUS | Status: AC
  Administered 2017-08-13: 1000 mL via INTRAVENOUS

## 2017-08-13 MED ORDER — CAPSAICIN 0.025 % EX CREA
TOPICAL_CREAM | Freq: Once | CUTANEOUS | Status: DC
  Filled 2017-08-13: qty 60

## 2017-08-13 MED ORDER — ONDANSETRON 8 MG PO TBDP
8.0000 mg | ORAL_TABLET | Freq: Once | ORAL | Status: AC
  Administered 2017-08-13: 8 mg via ORAL
  Filled 2017-08-13: qty 1

## 2017-08-13 MED ORDER — METOCLOPRAMIDE HCL 5 MG/ML IJ SOLN
10.0000 mg | INTRAMUSCULAR | Status: AC
  Administered 2017-08-13: 10 mg via INTRAVENOUS
  Filled 2017-08-13: qty 2

## 2017-08-13 MED ORDER — HALOPERIDOL LACTATE 5 MG/ML IJ SOLN
2.0000 mg | Freq: Once | INTRAMUSCULAR | Status: AC
  Administered 2017-08-13: 2 mg via INTRAVENOUS
  Filled 2017-08-13: qty 1

## 2017-08-13 NOTE — ED Provider Notes (Signed)
[facility] EMERGENCY DEPARTMENT Provider Note   CSN: [id number] Arrival date & time: 08/13/17  0755     History   Chief Complaint Chief Complaint  Patient presents with  . Abdominal Pain    HPI Tiffany Williamson is a 22 y.o. female.  22 year old female with past medical history including migraines, depression, marijuana use who presents with nausea, vomiting, diarrhea, and abdominal pain.  Patient began having vomiting, diarrhea, and diffuse severe abdominal cramping around 1 AM this morning.  Her cramping has continued and she reports siblings at home with similar symptoms.  She denies any fevers, reports mild cough.  She admits to marijuana use a few times per week.  No urinary symptoms.   The history is provided by the patient.  Abdominal Pain      Past Medical History:  Diagnosis Date  . Chronic UTI   . Depression   . Hyperemesis   . Migraine     Patient Active Problem List   Diagnosis Date Noted  . Nausea & vomiting 04/25/2017  . Vomiting 10/28/2016  . Intractable vomiting 10/28/2016  . Depression 10/28/2016  . Persistent proteinuria 10/28/2016  . Abdominal pain 10/28/2016  . Total bilirubin, elevated 10/28/2016  . Hypokalemia 10/28/2016    History reviewed. No pertinent surgical history.  OB History    No data available       Home Medications    Prior to Admission medications   Medication Sig Start Date End Date Taking? Authorizing Provider  FLUoxetine (PROZAC) 20 MG capsule Take 20 mg by mouth daily.   Yes [provider]  chlorproMAZINE (THORAZINE) 25 MG tablet Take 1 tablet (25 mg total) by mouth 3 (three) times daily as needed for nausea or vomiting. 06/27/17   Gwyneth Sprout, MD  levonorgestrel-ethinyl estradiol (NORDETTE) 0.15-30 MG-MCG tablet Take 1 tablet by mouth daily.    [provider]  ondansetron (ZOFRAN ODT) 4 MG disintegrating tablet Take 1 tablet (4 mg total) by mouth every 8 (eight) hours as needed for  nausea or vomiting. 08/13/17   Tayanna Talford, Ambrose Finland, MD  ondansetron (ZOFRAN) 4 MG tablet Take 1 tablet (4 mg total) by mouth every 8 (eight) hours as needed for nausea or vomiting. 10/15/16   Tegeler, Canary Brim, MD  pantoprazole (PROTONIX) 40 MG tablet Take 1 tablet (40 mg total) by mouth daily. 10/30/16   Arnetha Courser, MD  promethazine (PHENERGAN) 25 MG suppository Place 1 suppository (25 mg total) rectally every 6 (six) hours as needed for nausea or vomiting. 06/27/17   Gwyneth Sprout, MD  promethazine (PHENERGAN) 25 MG tablet Take 1 tablet (25 mg total) by mouth every 6 (six) hours as needed for nausea. 06/02/17   Fayrene Helper, PA-C  saccharomyces boulardii (FLORASTOR) 250 MG capsule Take 1 capsule (250 mg total) by mouth 2 (two) times daily. 04/26/17   Meredeth Ide, MD  sucralfate (CARAFATE) 1 GM/10ML suspension Take 10 mLs (1 g total) by mouth 4 (four) times daily -  with meals and at bedtime. 10/29/16   Arnetha Courser, MD  sulfamethoxazole-trimethoprim (BACTRIM,SEPTRA) 400-80 MG tablet Take 1 tablet by mouth 2 (two) times daily.    [provider]    Family History No family history on file.  Social History Social History   Tobacco Use  . Smoking status: Current Every Day Smoker    Packs/day: 0.50    Types: Cigarettes  . Smokeless tobacco: Never Used  Substance Use Topics  . Alcohol use: Yes  Comment: occ  . Drug use: Yes    Types: Marijuana     Allergies   Cinnamon and Nickel   Review of Systems Review of Systems  Gastrointestinal: Positive for abdominal pain.   All other systems reviewed and are negative except that which was mentioned in HPI   Physical Exam Updated Vital Signs BP 119/86   Pulse 64   Temp (!) 97.5 F (36.4 C) (Oral)   Resp 16   Ht 5\' 3"  (1.6 m)   Wt 46.7 kg (103 lb)   SpO2 100%   BMI 18.25 kg/m   Physical Exam  Constitutional: She is oriented to person, place, and time. She appears well-developed and well-nourished.    uncomfortable  HENT:  Head: Normocephalic and atraumatic.  Mouth/Throat: Oropharynx is clear and moist.  Moist mucous membranes  Eyes: Conjunctivae are normal. Pupils are equal, round, and reactive to light.  Neck: Neck supple.  Cardiovascular: Normal rate, regular rhythm and normal heart sounds.  No murmur heard. Pulmonary/Chest: Effort normal and breath sounds normal.  Abdominal: Soft. Bowel sounds are normal. She exhibits no distension. There is tenderness (generalized). There is no rebound and no guarding.  Musculoskeletal: She exhibits no edema.  Neurological: She is alert and oriented to person, place, and time.  Fluent speech  Skin: Skin is warm and dry. There is pallor.  Psychiatric: Judgment normal.  anxious  Nursing note and vitals reviewed.    ED Treatments / Results  Labs (all labs ordered are listed, but only abnormal results are displayed) Labs Reviewed  PREGNANCY, URINE    EKG  EKG Interpretation None       Radiology No results found.  Procedures Procedures (including critical care time)  Medications Ordered in ED Medications  capsaicin (ZOSTRIX) 0.025 % cream ( Topical Not Given 08/13/17 0929)  ondansetron (ZOFRAN-ODT) disintegrating tablet 8 mg (8 mg Oral Given 08/13/17 0827)  sodium chloride 0.9 % bolus 1,000 mL (0 mLs Intravenous Stopped 08/13/17 1025)  metoCLOPramide (REGLAN) injection 10 mg (10 mg Intravenous Given 08/13/17 0929)  haloperidol lactate (HALDOL) injection 2 mg (2 mg Intravenous Given 08/13/17 0929)     Initial Impression / Assessment and Plan / ED Course  I have reviewed the triage vital signs and the nursing notes.  Pertinent labs  that were available during my care of the patient were reviewed by me and considered in my medical decision making (see chart for details).    PT w/ sudden onset of N/V/D and abd cramping, known sick contacts.  Nontoxic on exam, reassuring vital signs.  Gave above medications including Haldol and  capsaicin given her report of marijuana use with history of hyperemesis; pt later refused capsaicin.  After receiving above medications, patient was ambulatory well-appearing on reassessment.  She stated that she felt much improved and wanted to go home.  Provided with Zofran to use as needed, discussed supportive measures.  Extensively reviewed return precautions.  Final Clinical Impressions(s) / ED Diagnoses   Final diagnoses:  Nausea vomiting and diarrhea  Abdominal cramping    ED Discharge Orders        Ordered    ondansetron (ZOFRAN ODT) 4 MG disintegrating tablet  Every 8 hours PRN     08/13/17 1037       Asra Gambrel, Ambrose Finlandachel Morgan, MD 08/13/17 1038

## 2017-08-13 NOTE — ED Notes (Signed)
Pt attempted urine sample but unable to void at this time.

## 2017-08-13 NOTE — ED Notes (Signed)
ptient asking about what she is going to get for pain, and abdominal cramping. Patient up and walking around to the restroom patient reports because she is having frequent loose stools

## 2017-08-13 NOTE — ED Notes (Signed)
Patient bow running to the restroom and asking for mesh panties, the patient states that she feels better and is ready to go home

## 2017-08-13 NOTE — ED Notes (Signed)
Patient up to restroom again  - hunched over walking

## 2017-08-13 NOTE — ED Triage Notes (Addendum)
abd cramping with N/V/D since 1 am. Siblings have had similar symptoms. Also reports hx of hyperemesis.

## 2017-08-13 NOTE — ED Notes (Signed)
Patients IV fluids still infusing - patient is laying in bed and intermittently ambulatory to the restroom without complaints

## 2017-08-30 ENCOUNTER — Ambulatory Visit (HOSPITAL_COMMUNITY)
Admission: EM | Admit: 2017-08-30 | Discharge: 2017-08-30 | Disposition: A | Discharge status: Home / Self Care | Payer: Medicaid Other | Attending: Family Medicine | Admitting: Family Medicine

## 2017-08-30 ENCOUNTER — Encounter (HOSPITAL_COMMUNITY): Payer: Self-pay | Admitting: Emergency Medicine

## 2017-08-30 DIAGNOSIS — H6981 Other specified disorders of Eustachian tube, right ear: Secondary | ICD-10-CM

## 2017-08-30 NOTE — ED Provider Notes (Signed)
  [facility]   [id number] 08/30/17 Arrival Time: 1000  ASSESSMENT & PLAN:  1. Eustachian tube dysfunction, right    OTC allergy medication for one week. May f/u with PCP or here as needed.  Reviewed expectations re: course of current medical issues. Questions answered. Outlined signs and symptoms indicating need for more acute intervention. Patient verbalized understanding. After Visit Summary given.   SUBJECTIVE: History from: patient.  Tiffany Williamson is a 22 y.o. female who presents with complaint of right otalgia without drainage. Onset gradual, approximately several days ago. Recent cold symptoms: minimal; congestion. Fever: no. Overall normal PO intake without n/v. Sick contacts: no. No specific aggravating or alleviating factors reported. OTC treatment: none.  Social History   Tobacco Use  Smoking Status Current Every Day Smoker  . Packs/day: 0.50  . Types: Cigarettes  Smokeless Tobacco Never Used    ROS: As per HPI.   OBJECTIVE:  Vitals:   08/30/17 1015 08/30/17 1017  BP:  119/77  Pulse:  94  Resp:  16  Temp:  98.8 F (37.1 C)  TempSrc:  Oral  SpO2:  98%  Weight: 110 lb (49.9 kg)      General appearance: alert; appears fatigued Ear Canal: normal TM: right: bulging, serous fluid noted Neck: supple without LAD Lungs: unlabored respirations, symmetrical air entry; cough: absent; no respiratory distress Skin: warm and dry Psychological: alert and cooperative; normal mood and affect  Allergies  Allergen Reactions  . Cinnamon   . Nickel Rash    Not specified     Past Medical History:  Diagnosis Date  . Chronic UTI   . Depression   . Hyperemesis   . Migraine    History reviewed. No pertinent family history. Social History   Socioeconomic History  . Marital status: Single    Spouse name: Not on file  . Number of children: Not on file  . Years of education: Not on file  . Highest education level: Not on file  Social Needs  .  Financial resource strain: Not on file  . Food insecurity - worry: Not on file  . Food insecurity - inability: Not on file  . Transportation needs - medical: Not on file  . Transportation needs - non-medical: Not on file  Occupational History  . Not on file  Tobacco Use  . Smoking status: Current Every Day Smoker    Packs/day: 0.50    Types: Cigarettes  . Smokeless tobacco: Never Used  Substance and Sexual Activity  . Alcohol use: Yes    Comment: occ  . Drug use: Yes    Types: Marijuana  . Sexual activity: Not on file  Other Topics Concern  . Not on file  Social History Narrative  . Not on file            Mardella LaymanHagler, Miller Edging[place], MD 08/30/17 1029

## 2017-08-30 NOTE — ED Triage Notes (Signed)
PT reports right ear pain and sore throat for 3-4 days

## 2017-09-12 ENCOUNTER — Ambulatory Visit (HOSPITAL_COMMUNITY)
Admission: EM | Admit: 2017-09-12 | Discharge: 2017-09-12 | Disposition: A | Discharge status: Home / Self Care | Payer: Medicaid Other | Attending: Family Medicine | Admitting: Family Medicine

## 2017-09-12 ENCOUNTER — Other Ambulatory Visit: Payer: Self-pay

## 2017-09-12 ENCOUNTER — Encounter (HOSPITAL_COMMUNITY): Payer: Self-pay | Admitting: Emergency Medicine

## 2017-09-12 DIAGNOSIS — H6983 Other specified disorders of Eustachian tube, bilateral: Secondary | ICD-10-CM | POA: Diagnosis not present

## 2017-09-12 DIAGNOSIS — H60393 Other infective otitis externa, bilateral: Secondary | ICD-10-CM

## 2017-09-12 MED ORDER — NEOMYCIN-POLYMYXIN-HC 3.5-10000-1 OT SUSP: 4.0000 [drp] | Freq: Four times a day (QID) | OTIC | 0 refills | Status: AC

## 2017-09-12 MED ORDER — MELOXICAM 7.5 MG PO TABS: 7.5000 mg | ORAL_TABLET | Freq: Every day | ORAL | 0 refills | Status: DC

## 2017-09-12 MED ORDER — FLUTICASONE PROPIONATE 50 MCG/ACT NA SUSP: 2.0000 | Freq: Every day | NASAL | 0 refills | Status: DC

## 2017-09-12 MED ORDER — CETIRIZINE-PSEUDOEPHEDRINE ER 5-120 MG PO TB12: 1.0000 | ORAL_TABLET | Freq: Every day | ORAL | 0 refills | Status: DC

## 2017-09-12 MED ORDER — IPRATROPIUM BROMIDE 0.06 % NA SOLN: 2.0000 | Freq: Four times a day (QID) | NASAL | 0 refills | Status: DC

## 2017-09-12 NOTE — Discharge Instructions (Signed)
As discussed, your ear pain could be due to outer ear infection, as well as eustachian tube dysfunction.  Continue Zyrtec-D, add Flonase and Atrovent nasal spray to help with the symptoms. Start Cortisporin eardrops as directed to help with outer ear infection. You can take Mobic to help with the pain while medications work. Follow up here or with PCP if symptoms still not improving in 1-2 weeks.

## 2017-09-12 NOTE — ED Triage Notes (Signed)
Runny nose and cough recently.  Right ear has hurt for 1 1/2 weeks.  Left ear has hurt since friday

## 2017-09-12 NOTE — ED Provider Notes (Signed)
[facility]    CSN: [id number] Arrival date & time: 09/12/17  1000     History   Chief Complaint Chief Complaint  Patient presents with  . URI    HPI Tiffany Williamson is a 22 y.o. female.   22 year old female comes in with mother for continued ear pain after being seen 2 weeks ago.  At that time was told had eustachian tube dysfunction of the right ear, and to start allergy medicine with decongestant.  She has been taking Zyrtec-D in the past 2 weeks without relief.  She continues to have nasal drainage and congestion.  Now with bilateral ear pain.  States pain is slightly different from before, stabbing pain.  Denies any cotton swab use, recent travels.  Denies fever, chills, night sweats.      Past Medical History:  Diagnosis Date  . Chronic UTI   . Depression   . Hyperemesis   . Migraine     Patient Active Problem List   Diagnosis Date Noted  . Nausea & vomiting 04/25/2017  . Vomiting 10/28/2016  . Intractable vomiting 10/28/2016  . Depression 10/28/2016  . Persistent proteinuria 10/28/2016  . Abdominal pain 10/28/2016  . Total bilirubin, elevated 10/28/2016  . Hypokalemia 10/28/2016    History reviewed. No pertinent surgical history.  OB History    No data available       Home Medications    Prior to Admission medications   Medication Sig Start Date End Date Taking? Authorizing Provider  cetirizine-pseudoephedrine (ZYRTEC-D) 5-120 MG tablet Take 1 tablet by mouth daily. 09/12/17   Cathie HoopsYu, Ray Gervasi V, PA-C  chlorproMAZINE (THORAZINE) 25 MG tablet Take 1 tablet (25 mg total) by mouth 3 (three) times daily as needed for nausea or vomiting. 06/27/17   Gwyneth SproutPlunkett, Whitney, MD  FLUoxetine (PROZAC) 20 MG capsule Take 20 mg by mouth daily.    [provider]  fluticasone (FLONASE) 50 MCG/ACT nasal spray Place 2 sprays into both nostrils daily. 09/12/17   Cathie HoopsYu, Marlee Armenteros V, PA-C  ipratropium (ATROVENT) 0.06 % nasal spray Place 2 sprays into both nostrils 4 (four)  times daily. 09/12/17   Cathie HoopsYu, Blen Ransome V, PA-C  meloxicam (MOBIC) 7.5 MG tablet Take 1 tablet (7.5 mg total) by mouth daily. 09/12/17   Cathie HoopsYu, Bambi Fehnel V, PA-C  neomycin-polymyxin-hydrocortisone (CORTISPORIN) 3.5-10000-1 OTIC suspension Place 4 drops into both ears 4 (four) times daily for 7 days. 09/12/17 09/19/17  Cathie HoopsYu, Ramiel Forti V, PA-C  ondansetron (ZOFRAN ODT) 4 MG disintegrating tablet Take 1 tablet (4 mg total) by mouth every 8 (eight) hours as needed for nausea or vomiting. 08/13/17   Little, Ambrose Finlandachel Morgan, MD  ondansetron (ZOFRAN) 4 MG tablet Take 1 tablet (4 mg total) by mouth every 8 (eight) hours as needed for nausea or vomiting. 10/15/16   Tegeler, Canary Brimhristopher J, MD  pantoprazole (PROTONIX) 40 MG tablet Take 1 tablet (40 mg total) by mouth daily. 10/30/16   Arnetha CourserAmin, Sumayya, MD  promethazine (PHENERGAN) 25 MG suppository Place 1 suppository (25 mg total) rectally every 6 (six) hours as needed for nausea or vomiting. 06/27/17   Gwyneth SproutPlunkett, Whitney, MD  promethazine (PHENERGAN) 25 MG tablet Take 1 tablet (25 mg total) by mouth every 6 (six) hours as needed for nausea. 06/02/17   Fayrene Helperran, Bowie, PA-C  saccharomyces boulardii (FLORASTOR) 250 MG capsule Take 1 capsule (250 mg total) by mouth 2 (two) times daily. 04/26/17   Meredeth IdeLama, Gagan S, MD    Family History Family History  Problem Relation Age of  Onset  . Healthy Mother     Social History Social History   Tobacco Use  . Smoking status: Current Every Day Smoker    Packs/day: 0.50    Types: Cigarettes  . Smokeless tobacco: Never Used  Substance Use Topics  . Alcohol use: Yes    Comment: occ  . Drug use: Yes    Types: Marijuana     Allergies   Cinnamon and Nickel   Review of Systems Review of Systems  Reason unable to perform ROS: See HPI as above.     Physical Exam Triage Vital Signs ED Triage Vitals  Enc Vitals Group     BP 09/12/17 1014 109/71     Pulse Rate 09/12/17 1014 92     Resp 09/12/17 1014 18     Temp 09/12/17 1014 98.7 F (37.1 C)      Temp Source 09/12/17 1014 Oral     SpO2 09/12/17 1014 97 %     Weight --      Height --      Head Circumference --      Peak Flow --      Pain Score 09/12/17 1010 6     Pain Loc --      Pain Edu? --      Excl. in GC? --    No data found.  Updated Vital Signs BP 109/71 (BP Location: Right Arm)   Pulse 92   Temp 98.7 F (37.1 C) (Oral)   Resp 18   SpO2 97%   Physical Exam  Constitutional: She is oriented to person, place, and time. She appears well-developed and well-nourished. No distress.  HENT:  Head: Normocephalic and atraumatic.  Nose: Rhinorrhea present. Right sinus exhibits no maxillary sinus tenderness and no frontal sinus tenderness. Left sinus exhibits no maxillary sinus tenderness and no frontal sinus tenderness.  Mouth/Throat: Uvula is midline and mucous membranes are normal. Posterior oropharyngeal erythema present. No tonsillar exudate.  Tenderness to palpation of bilateral tragus.  Mild erythema to the ear canal bilaterally.  TM both visible, without erythema or bulging, mild mid ear effusion.  Eyes: Conjunctivae are normal. Pupils are equal, round, and reactive to light.  Neurological: She is alert and oriented to person, place, and time.    UC Treatments / Results  Labs (all labs ordered are listed, but only abnormal results are displayed) Labs Reviewed - No data to display  EKG  EKG Interpretation None       Radiology No results found.  Procedures Procedures (including critical care time)  Medications Ordered in UC Medications - No data to display   Initial Impression / Assessment and Plan / UC Course  I have reviewed the triage vital signs and the nursing notes.  Pertinent labs & imaging results that were available during my care of the patient were reviewed by me and considered in my medical decision making (see chart for details).    Discussed with patient symptoms could be due to both eustachian tube dysfunction as well as otitis  externa.  Will add Flonase and Atrovent nasal spray to Zyrtec-D.  Start Cortisporin as directed.  Mobic as needed for pain.  Return precautions given.  Patient and mother expresses understanding and agrees to plan.  Final Clinical Impressions(s) / UC Diagnoses   Final diagnoses:  Dysfunction of both eustachian tubes  Infective otitis externa of both ears    ED Discharge Orders        Ordered    fluticasone (  FLONASE) 50 MCG/ACT nasal spray  Daily     09/12/17 1023    ipratropium (ATROVENT) 0.06 % nasal spray  4 times daily     09/12/17 1023    cetirizine-pseudoephedrine (ZYRTEC-D) 5-120 MG tablet  Daily     09/12/17 1023    meloxicam (MOBIC) 7.5 MG tablet  Daily     09/12/17 1023    neomycin-polymyxin-hydrocortisone (CORTISPORIN) 3.5-10000-1 OTIC suspension  4 times daily     09/12/17 1024        Belinda Fisher, PA-C 09/12/17 1030

## 2017-09-14 ENCOUNTER — Other Ambulatory Visit: Payer: Self-pay

## 2017-09-14 ENCOUNTER — Encounter: Payer: Self-pay | Admitting: Family Medicine

## 2017-09-14 ENCOUNTER — Ambulatory Visit (INDEPENDENT_AMBULATORY_CARE_PROVIDER_SITE_OTHER): Payer: Medicaid Other | Admitting: Family Medicine

## 2017-09-14 VITALS — BP 110/62 | HR 118 | Temp 98.7°F | Ht 63.0 in | Wt 106.4 lb

## 2017-09-14 DIAGNOSIS — F339 Major depressive disorder, recurrent, unspecified: Secondary | ICD-10-CM

## 2017-09-14 DIAGNOSIS — G43A0 Cyclical vomiting, not intractable: Secondary | ICD-10-CM | POA: Diagnosis not present

## 2017-09-14 DIAGNOSIS — F1290 Cannabis use, unspecified, uncomplicated: Secondary | ICD-10-CM

## 2017-09-14 DIAGNOSIS — Z7689 Persons encountering health services in other specified circumstances: Secondary | ICD-10-CM | POA: Diagnosis not present

## 2017-09-14 DIAGNOSIS — R824 Acetonuria: Secondary | ICD-10-CM | POA: Diagnosis not present

## 2017-09-14 DIAGNOSIS — R822 Biliuria: Secondary | ICD-10-CM | POA: Diagnosis not present

## 2017-09-14 DIAGNOSIS — R109 Unspecified abdominal pain: Secondary | ICD-10-CM | POA: Diagnosis present

## 2017-09-14 DIAGNOSIS — F17200 Nicotine dependence, unspecified, uncomplicated: Secondary | ICD-10-CM

## 2017-09-14 LAB — POCT URINALYSIS DIP (MANUAL ENTRY)
Blood, UA: NEGATIVE
GLUCOSE UA: NEGATIVE mg/dL
Leukocytes, UA: NEGATIVE
NITRITE UA: NEGATIVE
Protein Ur, POC: 30 mg/dL — AB
Urobilinogen, UA: 1 E.U./dL
pH, UA: 6 (ref 5.0–8.0)

## 2017-09-14 LAB — POCT UA - MICROSCOPIC ONLY

## 2017-09-14 MED ORDER — NICOTINE 14 MG/24HR TD PT24: 14.0000 mg | MEDICATED_PATCH | Freq: Every day | TRANSDERMAL | 0 refills | Status: DC

## 2017-09-14 NOTE — Assessment & Plan Note (Addendum)
UA   UA with hyperbilirubinuria (this has been consistent with past patient is not jaundiced on exam)  Also has hx of ketonuria so I have ordered a A1C for a lab collect and notified the patient since there is no recent A1C on file.

## 2017-09-14 NOTE — Assessment & Plan Note (Signed)
Half pack/day, willing to try patch  Will start 14mg 

## 2017-09-14 NOTE — Progress Notes (Signed)
Subjective:  Tiffany Williamson is a 22 y.o. female who [facility] with a chief complaint of establishing care.   HPI: Patient's only physical complaint today is a slight flank pain consistent with past instances of recurrent UTI.  She denies any specific urinary symptoms/bleeding/discharge.  She has had no fever and and no gastro symptoms.  She has no belly pain.  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  Her mother is present and she consents to full discussion in front of mother.  We discussed her mental health and she says she is in a good place right now.  She takes prozac ~4-5 days/wk because if she is feeling great (usually on weekend with boyfriend) she does not take it.  We discussed importance of regularity with this miedication  Patient smokes a half pack /day.  Feels the fear of withdrawal is main reason to keep smoking so she is willing to try nicotine patch.  Patient smokes marijuana and is not interested in quitting.  She resents past diagnosis of cannabis hyperemesis and is clear that she thinks it is cyclic vomiting.   She has not vomited in months and smokes consistently.  ROS   Objective:  Physical Exam: BP 110/62   Pulse (!) 118   Temp 98.7 F (37.1 C) (Oral)   Ht 5\' 3"  (1.6 m)   Wt 106 lb 6.4 oz (48.3 kg)   SpO2 99%   BMI 18.85 kg/m   Gen: NAD, conversing comfortably CV: RRR with no murmurs appreciated Pulm: NWOB, CTAB with no crackles, wheezes, or rhonchi GI: Normal bowel sounds present. Soft, slightly tender to flank area but not belly, no rebound tenderness, no RUQ pain, no pain in RLQ Nondistended. MSK: no edema, cyanosis, or clubbing noted Skin: warm, dry Neuro: grossly normal, moves all extremities Psych: Normal affect and thought content  Results for orders placed or performed in visit on 09/14/17 (from the past 72 hour(s))  POCT urinalysis dipstick     Status: Abnormal   Collection Time:  09/14/17  3:00 PM  Result Value Ref Range   Color, UA yellow yellow   Clarity, UA clear clear   Glucose, UA negative negative mg/dL   Bilirubin, UA moderate (A) negative   Ketones, POC UA moderate (40) (A) negative mg/dL   Spec Grav, UA >=9.147>=1.030 (A) 1.010 - 1.025   Blood, UA negative negative   pH, UA 6.0 5.0 - 8.0   Protein Ur, POC =30 (A) negative mg/dL   Urobilinogen, UA 1.0 0.2 or 1.0 E.U./dL   Nitrite, UA Negative Negative   Leukocytes, UA Negative Negative  POCT UA - Microscopic Only     Status: None   Collection Time: 09/14/17  3:00 PM  Result Value Ref Range   WBC, Ur, HPF, POC NONE    RBC, urine, microscopic 0-3    Bacteria, U Microscopic TRACE    Mucus, UA 3+    Epithelial cells, urine per micros >20      Assessment/Plan:  Flank pain UA   UA with hyperbilirubinuria (this has been consistent with past patient is not jaundiced on exam)  Also has hx of ketonuria so I have ordered a A1C for a lab collect and notified the patient since there is no recent A1C on file.  Tobacco use disorder Half pack/day, willing to try patch  Will start 14mg   Depression Currently stable, no SI/HI.  Has been hospitalized in past.  On prozac chronically.  Will continue meds.  Encouraged to re-establish a primary mental health provider as she has lost touch with [facility]  Cyclical vomiting associated with migraine Patient goes through bouts every few months where she will vomit intractably for 2-7 days.  No known trigger and no known alleviating.   She has not had an episode for months and still smokes marijuana consistently.  She is frustrated with past diagnoses of cannabis hyperemesis.  She has never been on prohylaxis for this.  Also has migraines, 1 every 1-2 weeks for a few hours with near incapacitation.   Will look further into propranolol for prophylaxis.  TCA would be first line but I don't wish to interfere with a stable depression treatment   Marthenia Rolling, DO FAMILY MEDICINE  RESIDENT - PGY1 09/15/2017 8:08 PM

## 2017-09-14 NOTE — Assessment & Plan Note (Addendum)
Currently stable, no SI/HI.  Has been hospitalized in past.  On prozac chronically.  Will continue meds.  Encouraged to re-establish a primary mental health provider as she has lost touch with [facility]

## 2017-09-14 NOTE — Assessment & Plan Note (Addendum)
Patient goes through bouts every few months where she will vomit intractably for 2-7 days.  No known trigger and no known alleviating.   She has not had an episode for months and still smokes marijuana consistently.  She is frustrated with past diagnoses of cannabis hyperemesis.  She has never been on prohylaxis for this.  Also has migraines, 1 every 1-2 weeks for a few hours with near incapacitation.   Will look further into propranolol for prophylaxis.  TCA would be first line but I don't wish to interfere with a stable depression treatment

## 2017-09-15 ENCOUNTER — Other Ambulatory Visit: Payer: Medicaid Other

## 2017-09-15 DIAGNOSIS — R824 Acetonuria: Secondary | ICD-10-CM

## 2017-09-16 LAB — HEMOGLOBIN A1C
Est. average glucose Bld gHb Est-mCnc: 97 mg/dL
Hgb A1c MFr Bld: 5 % (ref 4.8–5.6)

## 2017-09-17 ENCOUNTER — Encounter: Payer: Self-pay | Admitting: Family Medicine

## 2017-10-01 ENCOUNTER — Other Ambulatory Visit: Payer: Self-pay

## 2017-10-01 ENCOUNTER — Emergency Department (HOSPITAL_BASED_OUTPATIENT_CLINIC_OR_DEPARTMENT_OTHER)
Admission: EM | Admit: 2017-10-01 | Discharge: 2017-10-01 | Disposition: A | Discharge status: Home / Self Care | Payer: Medicaid Other | Attending: Emergency Medicine | Admitting: Emergency Medicine

## 2017-10-01 ENCOUNTER — Encounter (HOSPITAL_BASED_OUTPATIENT_CLINIC_OR_DEPARTMENT_OTHER): Payer: Self-pay | Admitting: Emergency Medicine

## 2017-10-01 ENCOUNTER — Emergency Department (HOSPITAL_BASED_OUTPATIENT_CLINIC_OR_DEPARTMENT_OTHER): Payer: Medicaid Other

## 2017-10-01 DIAGNOSIS — N390 Urinary tract infection, site not specified: Secondary | ICD-10-CM | POA: Insufficient documentation

## 2017-10-01 DIAGNOSIS — R109 Unspecified abdominal pain: Secondary | ICD-10-CM | POA: Diagnosis present

## 2017-10-01 DIAGNOSIS — F1210 Cannabis abuse, uncomplicated: Secondary | ICD-10-CM | POA: Diagnosis not present

## 2017-10-01 DIAGNOSIS — R350 Frequency of micturition: Secondary | ICD-10-CM | POA: Diagnosis not present

## 2017-10-01 DIAGNOSIS — R1084 Generalized abdominal pain: Secondary | ICD-10-CM | POA: Diagnosis not present

## 2017-10-01 DIAGNOSIS — Z79899 Other long term (current) drug therapy: Secondary | ICD-10-CM | POA: Diagnosis not present

## 2017-10-01 DIAGNOSIS — R197 Diarrhea, unspecified: Secondary | ICD-10-CM | POA: Insufficient documentation

## 2017-10-01 DIAGNOSIS — R112 Nausea with vomiting, unspecified: Secondary | ICD-10-CM | POA: Insufficient documentation

## 2017-10-01 DIAGNOSIS — F17210 Nicotine dependence, cigarettes, uncomplicated: Secondary | ICD-10-CM | POA: Insufficient documentation

## 2017-10-01 LAB — COMPREHENSIVE METABOLIC PANEL
ALBUMIN: 5 g/dL (ref 3.5–5.0)
ALT: 16 U/L (ref 14–54)
AST: 39 U/L (ref 15–41)
Alkaline Phosphatase: 50 U/L (ref 38–126)
Anion gap: 19 — ABNORMAL HIGH (ref 5–15)
BUN: 12 mg/dL (ref 6–20)
CALCIUM: 9.5 mg/dL (ref 8.9–10.3)
CO2: 14 mmol/L — AB (ref 22–32)
CREATININE: 0.84 mg/dL (ref 0.44–1.00)
Chloride: 105 mmol/L (ref 101–111)
GFR calc Af Amer: >60 mL/min (ref >60)
GFR calc non Af Amer: >60 mL/min (ref >60)
GLUCOSE: 219 mg/dL — AB (ref 65–99)
Potassium: 3 mmol/L — ABNORMAL LOW (ref 3.5–5.1)
Sodium: 138 mmol/L (ref 135–145)
Total Bilirubin: 1.1 mg/dL (ref 0.3–1.2)
Total Protein: 7.6 g/dL (ref 6.5–8.1)

## 2017-10-01 LAB — CBC WITH DIFFERENTIAL/PLATELET
BASOS PCT: 0 %
Basophils Absolute: 0 10*3/uL (ref 0.0–0.1)
EOS ABS: 0 10*3/uL (ref 0.0–0.7)
Eosinophils Relative: 0 %
HCT: 40.3 % (ref 36.0–46.0)
HEMOGLOBIN: 14.3 g/dL (ref 12.0–15.0)
LYMPHS ABS: 1.9 10*3/uL (ref 0.7–4.0)
Lymphocytes Relative: 10 %
MCH: 32.7 pg (ref 26.0–34.0)
MCHC: 35.5 g/dL (ref 30.0–36.0)
MCV: 92.2 fL (ref 78.0–100.0)
Monocytes Absolute: 0.8 10*3/uL (ref 0.1–1.0)
Monocytes Relative: 4 %
NEUTROS PCT: 86 %
Neutro Abs: 16.1 10*3/uL — ABNORMAL HIGH (ref 1.7–7.7)
Platelets: 291 10*3/uL (ref 150–400)
RBC: 4.37 MIL/uL (ref 3.87–5.11)
RDW: 11.8 % (ref 11.5–15.5)
WBC: 18.8 10*3/uL — AB (ref 4.0–10.5)

## 2017-10-01 LAB — URINALYSIS, ROUTINE W REFLEX MICROSCOPIC
Bilirubin Urine: NEGATIVE
Glucose, UA: 100 mg/dL — AB
KETONES UR: 40 mg/dL — AB
NITRITE: NEGATIVE
Protein, ur: NEGATIVE mg/dL
pH: 6 (ref 5.0–8.0)

## 2017-10-01 LAB — URINALYSIS, MICROSCOPIC (REFLEX): RBC / HPF: NONE SEEN RBC/hpf (ref 0–5)

## 2017-10-01 LAB — PREGNANCY, URINE: Preg Test, Ur: NEGATIVE

## 2017-10-01 LAB — LIPASE, BLOOD: Lipase: 21 U/L (ref 11–51)

## 2017-10-01 MED ORDER — CEPHALEXIN 500 MG PO CAPS: 500.0000 mg | ORAL_CAPSULE | Freq: Three times a day (TID) | ORAL | 0 refills | Status: AC

## 2017-10-01 MED ORDER — SODIUM CHLORIDE 0.9 % IV BOLUS
1000.0000 mL | Freq: Once | INTRAVENOUS | Status: AC
  Administered 2017-10-01: 1000 mL via INTRAVENOUS

## 2017-10-01 MED ORDER — MORPHINE SULFATE (PF) 4 MG/ML IV SOLN
4.0000 mg | Freq: Once | INTRAVENOUS | Status: AC
  Administered 2017-10-01: 4 mg via INTRAVENOUS
  Filled 2017-10-01: qty 1

## 2017-10-01 MED ORDER — POTASSIUM CHLORIDE CRYS ER 20 MEQ PO TBCR
40.0000 meq | EXTENDED_RELEASE_TABLET | Freq: Once | ORAL | Status: AC
  Administered 2017-10-01: 40 meq via ORAL
  Filled 2017-10-01: qty 2

## 2017-10-01 MED ORDER — SODIUM CHLORIDE 0.9 % IV SOLN
1.0000 g | Freq: Once | INTRAVENOUS | Status: AC
  Administered 2017-10-01: 1 g via INTRAVENOUS
  Filled 2017-10-01: qty 10

## 2017-10-01 MED ORDER — PROMETHAZINE HCL 25 MG/ML IJ SOLN
25.0000 mg | Freq: Once | INTRAMUSCULAR | Status: AC
  Administered 2017-10-01: 25 mg via INTRAVENOUS
  Filled 2017-10-01: qty 1

## 2017-10-01 MED ORDER — PROMETHAZINE HCL 25 MG RE SUPP: 25.0000 mg | Freq: Four times a day (QID) | RECTAL | 0 refills | Status: DC | PRN

## 2017-10-01 NOTE — Discharge Instructions (Signed)
Your workup today showed evidence of possible urinary tract infection.  Given your history of these and the symptoms you are having, we will treated for UTI.  Please use the nausea medicine at home to help with your symptoms and follow-up with your primary doctor.  If any symptoms change or worsen, please return to the nearest emergency department.  Please stay hydrated.

## 2017-10-01 NOTE — ED Notes (Signed)
Patient transported to X-ray 

## 2017-10-01 NOTE — ED Notes (Signed)
Pt amb to BR, but says she is going to have a BM, and cannot provide urine sample.

## 2017-10-01 NOTE — ED Notes (Signed)
Pt c/o the medication not working and she is still vomiting. No vomiting noted at this time. RN made aware.

## 2017-10-01 NOTE — ED Notes (Signed)
Pt has been ambulating to BR to vomit; this RN asked pt to use emesis bags from now on so we will be able to measure the amt of emesis; pt agreeable.

## 2017-10-01 NOTE — ED Provider Notes (Signed)
[facility] EMERGENCY DEPARTMENT Provider Note   CSN: [id number] Arrival date & time: 10/01/17  [id number]     History   Chief Complaint Chief Complaint  Patient presents with  . Abdominal Pain    HPI Tiffany Williamson is a 22 y.o. female.  The history is provided by the patient, a parent and medical records.  Abdominal Pain   This is a recurrent problem. The current episode started 12 to 24 hours ago. The problem occurs constantly. The problem has not changed since onset.The pain is associated with an unknown factor. The pain is located in the generalized abdominal region. The quality of the pain is aching, cramping and sharp. The pain is at a severity of 10/10. The pain is severe. Associated symptoms include diarrhea, nausea, vomiting and frequency. Pertinent negatives include fever, constipation, dysuria, hematuria and headaches. The symptoms are aggravated by palpation and eating. Nothing relieves the symptoms. Past workup includes CT scan.    Past Medical History:  Diagnosis Date  . Chronic UTI   . Depression   . Hyperemesis   . Migraine     Patient Active Problem List   Diagnosis Date Noted  . Encounter to establish care 09/14/2017  . Tobacco use disorder 09/14/2017  . Marijuana use 09/14/2017  . Cyclical vomiting associated with migraine 10/28/2016  . Depression 10/28/2016  . Persistent proteinuria 10/28/2016  . Flank pain 10/28/2016  . Hypokalemia 10/28/2016    History reviewed. No pertinent surgical history.   OB History   None      Home Medications    Prior to Admission medications   Medication Sig Start Date End Date Taking? Authorizing Provider  cetirizine-pseudoephedrine (ZYRTEC-D) 5-120 MG tablet Take 1 tablet by mouth daily. 09/12/17   Cathie HoopsYu, Amy V, PA-C  chlorproMAZINE (THORAZINE) 25 MG tablet Take 1 tablet (25 mg total) by mouth 3 (three) times daily as needed for nausea or vomiting. 06/27/17   Gwyneth SproutPlunkett, Whitney, MD  FLUoxetine (PROZAC) 20 MG  capsule Take 20 mg by mouth daily.    [provider]  fluticasone (FLONASE) 50 MCG/ACT nasal spray Place 2 sprays into both nostrils daily. 09/12/17   Cathie HoopsYu, Amy V, PA-C  ipratropium (ATROVENT) 0.06 % nasal spray Place 2 sprays into both nostrils 4 (four) times daily. 09/12/17   Cathie HoopsYu, Amy V, PA-C  meloxicam (MOBIC) 7.5 MG tablet Take 1 tablet (7.5 mg total) by mouth daily. 09/12/17   Cathie HoopsYu, Amy V, PA-C  nicotine (NICODERM CQ - DOSED IN MG/24 HOURS) 14 mg/24hr patch Place 1 patch (14 mg total) onto the skin daily. 09/14/17   Bland, Scott, DO  ondansetron (ZOFRAN ODT) 4 MG disintegrating tablet Take 1 tablet (4 mg total) by mouth every 8 (eight) hours as needed for nausea or vomiting. 08/13/17   Little, Ambrose Finlandachel Morgan, MD  ondansetron (ZOFRAN) 4 MG tablet Take 1 tablet (4 mg total) by mouth every 8 (eight) hours as needed for nausea or vomiting. 10/15/16   Keighley Deckman, Canary Brimhristopher J, MD  pantoprazole (PROTONIX) 40 MG tablet Take 1 tablet (40 mg total) by mouth daily. 10/30/16   Arnetha CourserAmin, Sumayya, MD  promethazine (PHENERGAN) 25 MG suppository Place 1 suppository (25 mg total) rectally every 6 (six) hours as needed for nausea or vomiting. 06/27/17   Gwyneth SproutPlunkett, Whitney, MD  promethazine (PHENERGAN) 25 MG tablet Take 1 tablet (25 mg total) by mouth every 6 (six) hours as needed for nausea. 06/02/17   Fayrene Helperran, Bowie, PA-C  saccharomyces boulardii (FLORASTOR) 250 MG capsule Take  1 capsule (250 mg total) by mouth 2 (two) times daily. 04/26/17   Meredeth Ide, MD    Family History Family History  Problem Relation Age of Onset  . Healthy Mother     Social History Social History   Tobacco Use  . Smoking status: Current Every Day Smoker    Packs/day: 0.50    Types: Cigarettes  . Smokeless tobacco: Never Used  Substance Use Topics  . Alcohol use: Yes    Comment: occ  . Drug use: Yes    Types: Marijuana     Allergies   Cinnamon and Nickel   Review of Systems Review of Systems  Constitutional: Negative for  chills, diaphoresis, fatigue and fever.  HENT: Negative for congestion and rhinorrhea.   Eyes: Negative for visual disturbance.  Respiratory: Negative for cough, chest tightness and shortness of breath.   Cardiovascular: Negative for chest pain, palpitations and leg swelling.  Gastrointestinal: Positive for abdominal pain, diarrhea, nausea and vomiting. Negative for abdominal distention and constipation.  Genitourinary: Positive for frequency. Negative for decreased urine volume, dysuria, flank pain and hematuria.  Musculoskeletal: Negative for back pain and neck pain.  Skin: Negative for rash.  Neurological: Negative for light-headedness and headaches.  Psychiatric/Behavioral: Negative for agitation.  All other systems reviewed and are negative.    Physical Exam Updated Vital Signs BP 130/84 (BP Location: Left Arm)   Pulse 73   Resp 20   SpO2 99%   Physical Exam  Constitutional: She is oriented to person, place, and time. She appears well-developed and well-nourished.  Non-toxic appearance. She does not appear ill. She appears distressed.  HENT:  Head: Normocephalic.  Nose: Nose normal.  Mouth/Throat: No oropharyngeal exudate.  Eyes: Pupils are equal, round, and reactive to light. Conjunctivae and EOM are normal.  Neck: Normal range of motion.  Cardiovascular: Normal rate and intact distal pulses.  No murmur heard. Pulmonary/Chest: Effort normal and breath sounds normal. No respiratory distress. She has no wheezes. She exhibits no tenderness.  Abdominal: Soft. Normal appearance and bowel sounds are normal. She exhibits no distension. There is generalized tenderness. There is no rigidity, no rebound, no guarding and no CVA tenderness.  Musculoskeletal: She exhibits no edema or tenderness.  Lymphadenopathy:    She has no cervical adenopathy.  Neurological: She is alert and oriented to person, place, and time.  Skin: Skin is warm. Capillary refill takes less than 2 seconds. No  rash noted. She is not diaphoretic. No erythema.  Psychiatric: She has a normal mood and affect.  Nursing note and vitals reviewed.    ED Treatments / Results  Labs (all labs ordered are listed, but only abnormal results are displayed) Labs Reviewed  URINALYSIS, ROUTINE W REFLEX MICROSCOPIC - Abnormal; Notable for the following components:      Result Value   APPearance HAZY (*)    Specific Gravity, Urine >1.030 (*)    Glucose, UA 100 (*)    Hgb urine dipstick TRACE (*)    Ketones, ur 40 (*)    Leukocytes, UA TRACE (*)    All other components within normal limits  CBC WITH DIFFERENTIAL/PLATELET - Abnormal; Notable for the following components:   WBC 18.8 (*)    Neutro Abs 16.1 (*)    All other components within normal limits  COMPREHENSIVE METABOLIC PANEL - Abnormal; Notable for the following components:   Potassium 3.0 (*)    CO2 14 (*)    Glucose, Bld 219 (*)    Anion  gap 19 (*)    All other components within normal limits  URINALYSIS, MICROSCOPIC (REFLEX) - Abnormal; Notable for the following components:   Bacteria, UA MANY (*)    Squamous Epithelial / LPF 6-30 (*)    All other components within normal limits  URINE CULTURE  PREGNANCY, URINE  LIPASE, BLOOD    EKG None  Radiology Dg Chest 2 View  Result Date: 10/01/2017 CLINICAL DATA:  22 year old female with emesis and diarrhea. EXAM: CHEST - 2 VIEW COMPARISON:  None. FINDINGS: The lungs are clear and negative for focal airspace consolidation, pulmonary edema or suspicious pulmonary nodule. No pleural effusion or pneumothorax. Cardiac and mediastinal contours are within normal limits. No acute fracture or lytic or blastic osseous lesions. The visualized upper abdominal bowel gas pattern is unremarkable. IMPRESSION: Normal chest x-ray. Electronically Signed   By: Malachy Moan M.D.   On: 10/01/2017 10:06    Procedures Procedures (including critical care time)  Medications Ordered in ED Medications  sodium  chloride 0.9 % bolus 1,000 mL (0 mLs Intravenous Stopped 10/01/17 1007)  sodium chloride 0.9 % bolus 1,000 mL (0 mLs Intravenous Stopped 10/01/17 1110)  morphine 4 MG/ML injection 4 mg (4 mg Intravenous Given 10/01/17 0922)  promethazine (PHENERGAN) injection 25 mg (25 mg Intravenous Given 10/01/17 0917)  morphine 4 MG/ML injection 4 mg (4 mg Intravenous Given 10/01/17 1235)  sodium chloride 0.9 % bolus 1,000 mL (0 mLs Intravenous Stopped 10/01/17 1346)  promethazine (PHENERGAN) injection 25 mg (25 mg Intravenous Given 10/01/17 1232)  potassium chloride SA (K-DUR,KLOR-CON) CR tablet 40 mEq (40 mEq Oral Given 10/01/17 1346)  cefTRIAXone (ROCEPHIN) 1 g in sodium chloride 0.9 % 100 mL IVPB (0 g Intravenous Stopped 10/01/17 1309)     Initial Impression / Assessment and Plan / ED Course  I have reviewed the triage vital signs and the nursing notes.  Pertinent labs & imaging results that were available during my care of the patient were reviewed by me and considered in my medical decision making (see chart for details).     Tiffany Williamson is a 22 y.o. female with a past medical history significant for depression, cyclical vomiting, hyperemesis, and recurrent urinary tract infections who presents with nausea, vomiting, diarrhea, abdominal cramping, and dry cough.  Patient reports that several times a year she has episodes of nausea and vomiting and is been seeing a primary doctor to discuss further management of it.  She says that she is been doing well until this morning when she woke up with severe nausea, vomiting, diarrhea.  She reports some severe abdominal cramping all over her abdomen.  She reports some chills and dry cough for the last few days.  She denies any constipation, vaginal discharge, vaginal bleeding.  She denies any recent trauma.  She reports that she took antibiotics for an ear infection and went to the doctor last week and is unsure if she is exposed to any other sick contacts.  She denies any  further ear problems.  She denies other complaints on arrival including no palpitations or severe chest pain.  On exam, abdomen is diffusely tender.  Lungs are clear and chest is nontender.  Patient alert and oriented.  No significant edema seen.  Back nontender.  Based on patient's description of symptoms patient may have a viral gastroenteritis on top of her chronic GI issues.  Patient will be given fluids.  A discussion of medications was held with the patient going through prior visits and she  reports that she did not tolerate the capsaicin cream and did not tolerate Haldol in the past.  Agreement made to give the patient 1 dose of a pain medication due to the discomfort as well as Phenergan for nausea.  Patient will be given fluids for rehydration and urinalysis and blood work will be checked.  Chest x-ray will also be obtained due to the coughing.  Anticipate reassessment after workup but I suspect patient is dehydrated causing her symptoms.  Diagnostic testing revealed leukocytes and bacteria.  Given her history of urinary tract infections and the urinary frequency, suspect she has UTI.  Patient given dose of antibiotics.  Patient felt better after several doses of pain and nausea medicine.  Other laboratory testing was overall reassuring.  Do not feel patient requires admission as she is feeling better.  Patient was able to tolerate fluids.  Patient and mother agree with plan for discharge after antibiotics were started.  Patient will follow up with PCP for further management.  Patient had no other questions or concerns and was discharged in good condition.   Final Clinical Impressions(s) / ED Diagnoses   Final diagnoses:  Lower urinary tract infectious disease  Generalized abdominal pain  Nausea vomiting and diarrhea    ED Discharge Orders        Ordered    promethazine (PHENERGAN) 25 MG suppository  Every 6 hours PRN     10/01/17 1547    cephALEXin (KEFLEX) 500 MG capsule  3 times  daily     10/01/17 1547      Clinical Impression: 1. Lower urinary tract infectious disease   2. Generalized abdominal pain   3. Nausea vomiting and diarrhea     Disposition: Discharge  Condition: Good  I have discussed the results, Dx and Tx plan with the pt(& family if present). He/she/they expressed understanding and agree(s) with the plan. Discharge instructions discussed at great length. Strict return precautions discussed and pt &/or family have verbalized understanding of the instructions. No further questions at time of discharge.    Discharge Medication List as of 10/01/2017  3:48 PM    START taking these medications   Details  cephALEXin (KEFLEX) 500 MG capsule Take 1 capsule (500 mg total) by mouth 3 (three) times daily for 10 days., Starting Sun 10/01/2017, Until Wed 10/11/2017, Print    !! promethazine (PHENERGAN) 25 MG suppository Place 1 suppository (25 mg total) rectally every 6 (six) hours as needed for nausea or vomiting., Starting Sun 10/01/2017, Print     !! - Potential duplicate medications found. Please discuss with provider.      Follow Up: [facility] [address] [place] [address] [phone] Schedule an appointment as soon as possible for a visit    [facility] [place] [facility]Y [facility]MENT [address] [id number] [place] [address] [phone]       Ashok Sawaya, Canary Brim, MD 10/01/17 2015

## 2017-10-01 NOTE — ED Triage Notes (Signed)
abd pain and vomiting since 1 this morning.

## 2017-10-02 LAB — URINE CULTURE

## 2017-11-10 ENCOUNTER — Ambulatory Visit: Payer: Self-pay | Admitting: Family Medicine

## 2017-11-18 ENCOUNTER — Emergency Department (HOSPITAL_BASED_OUTPATIENT_CLINIC_OR_DEPARTMENT_OTHER)
Admission: EM | Admit: 2017-11-18 | Discharge: 2017-11-18 | Disposition: A | Discharge status: Home / Self Care | Payer: Medicaid Other | Attending: Emergency Medicine | Admitting: Emergency Medicine

## 2017-11-18 ENCOUNTER — Encounter (HOSPITAL_BASED_OUTPATIENT_CLINIC_OR_DEPARTMENT_OTHER): Payer: Self-pay | Admitting: Emergency Medicine

## 2017-11-18 ENCOUNTER — Other Ambulatory Visit (HOSPITAL_COMMUNITY): Payer: Self-pay

## 2017-11-18 ENCOUNTER — Other Ambulatory Visit: Payer: Self-pay

## 2017-11-18 DIAGNOSIS — F17210 Nicotine dependence, cigarettes, uncomplicated: Secondary | ICD-10-CM | POA: Diagnosis not present

## 2017-11-18 DIAGNOSIS — E876 Hypokalemia: Secondary | ICD-10-CM | POA: Insufficient documentation

## 2017-11-18 DIAGNOSIS — Z79899 Other long term (current) drug therapy: Secondary | ICD-10-CM | POA: Diagnosis not present

## 2017-11-18 DIAGNOSIS — R112 Nausea with vomiting, unspecified: Secondary | ICD-10-CM | POA: Diagnosis present

## 2017-11-18 DIAGNOSIS — R197 Diarrhea, unspecified: Secondary | ICD-10-CM | POA: Diagnosis not present

## 2017-11-18 HISTORY — DX: Cannabis abuse, uncomplicated: F12.10

## 2017-11-18 LAB — CBC WITH DIFFERENTIAL/PLATELET
BASOS PCT: 0 %
Basophils Absolute: 0 10*3/uL (ref 0.0–0.1)
EOS ABS: 0.1 10*3/uL (ref 0.0–0.7)
EOS PCT: 1 %
HCT: 38.2 % (ref 36.0–46.0)
Hemoglobin: 14 g/dL (ref 12.0–15.0)
Lymphocytes Relative: 22 %
Lymphs Abs: 3.1 10*3/uL (ref 0.7–4.0)
MCH: 33.3 pg (ref 26.0–34.0)
MCHC: 36.6 g/dL — AB (ref 30.0–36.0)
MCV: 90.7 fL (ref 78.0–100.0)
MONO ABS: 0.9 10*3/uL (ref 0.1–1.0)
MONOS PCT: 6 %
Neutro Abs: 10.2 10*3/uL — ABNORMAL HIGH (ref 1.7–7.7)
Neutrophils Relative %: 71 %
PLATELETS: 256 10*3/uL (ref 150–400)
RBC: 4.21 MIL/uL (ref 3.87–5.11)
RDW: 11.6 % (ref 11.5–15.5)
WBC: 14.3 10*3/uL — ABNORMAL HIGH (ref 4.0–10.5)

## 2017-11-18 LAB — COMPREHENSIVE METABOLIC PANEL
ALBUMIN: 5 g/dL (ref 3.5–5.0)
ALK PHOS: 54 U/L (ref 38–126)
ALT: 13 U/L — AB (ref 14–54)
AST: 29 U/L (ref 15–41)
Anion gap: 15 (ref 5–15)
BILIRUBIN TOTAL: 0.6 mg/dL (ref 0.3–1.2)
BUN: 15 mg/dL (ref 6–20)
CO2: 16 mmol/L — AB (ref 22–32)
CREATININE: 0.77 mg/dL (ref 0.44–1.00)
Calcium: 9.2 mg/dL (ref 8.9–10.3)
Chloride: 107 mmol/L (ref 101–111)
GFR calc Af Amer: >60 mL/min (ref >60)
GFR calc non Af Amer: >60 mL/min (ref >60)
GLUCOSE: 204 mg/dL — AB (ref 65–99)
Potassium: 3.3 mmol/L — ABNORMAL LOW (ref 3.5–5.1)
SODIUM: 138 mmol/L (ref 135–145)
TOTAL PROTEIN: 7.4 g/dL (ref 6.5–8.1)

## 2017-11-18 LAB — LIPASE, BLOOD: LIPASE: 26 U/L (ref 11–51)

## 2017-11-18 MED ORDER — DIPHENHYDRAMINE HCL 50 MG/ML IJ SOLN
25.0000 mg | Freq: Once | INTRAMUSCULAR | Status: AC
  Administered 2017-11-18: 25 mg via INTRAVENOUS
  Filled 2017-11-18: qty 1

## 2017-11-18 MED ORDER — FAMOTIDINE IN NACL 20-0.9 MG/50ML-% IV SOLN
20.0000 mg | Freq: Once | INTRAVENOUS | Status: AC
  Administered 2017-11-18: 20 mg via INTRAVENOUS
  Filled 2017-11-18: qty 50

## 2017-11-18 MED ORDER — LORAZEPAM 2 MG/ML IJ SOLN
1.0000 mg | Freq: Once | INTRAMUSCULAR | Status: AC
  Administered 2017-11-18: 1 mg via INTRAVENOUS
  Filled 2017-11-18: qty 1

## 2017-11-18 MED ORDER — PROMETHAZINE HCL 25 MG PO TABS: 25.0000 mg | ORAL_TABLET | Freq: Three times a day (TID) | ORAL | 0 refills | Status: DC | PRN

## 2017-11-18 MED ORDER — METOCLOPRAMIDE HCL 5 MG/ML IJ SOLN
10.0000 mg | Freq: Once | INTRAMUSCULAR | Status: AC
  Administered 2017-11-18: 10 mg via INTRAVENOUS
  Filled 2017-11-18: qty 2

## 2017-11-18 MED ORDER — HALOPERIDOL LACTATE 5 MG/ML IJ SOLN
2.0000 mg | Freq: Once | INTRAMUSCULAR | Status: AC
  Administered 2017-11-18: 2 mg via INTRAVENOUS
  Filled 2017-11-18: qty 1

## 2017-11-18 MED ORDER — SODIUM CHLORIDE 0.9 % IV BOLUS
1000.0000 mL | Freq: Once | INTRAVENOUS | Status: AC
  Administered 2017-11-18: 1000 mL via INTRAVENOUS

## 2017-11-18 NOTE — ED Notes (Signed)
Pt made aware of the need for a urine specimen.

## 2017-11-18 NOTE — ED Triage Notes (Signed)
N/V/D since midnight last night. Last smoke WEED 4 days ago . Here multiple times for same

## 2017-11-18 NOTE — ED Notes (Signed)
ED Provider at bedside. 

## 2017-11-18 NOTE — ED Provider Notes (Signed)
[facility] EMERGENCY DEPARTMENT Provider Note   CSN: [id number] Arrival date & time: 11/18/17  0702     History   Chief Complaint Chief Complaint  Patient presents with  . Vomiting    HPI Tiffany Williamson is a 22 y.o. female.  The history is provided by the patient. No language interpreter was used.   Tiffany Williamson is a 22 y.o. female who presents to the Emergency Department complaining of N/V/D. Around midnight she developed nausea, vomiting, diarrhea. About an hour and a half prior to this she developed epigastric abdominal pain and cramping. She removed reports numerous episodes of vomiting and diarrhea. No fevers, dysuria. No known sick contacts or bad food exposures. She has experienced similar symptoms in the past and it has been attributed to marijuana use. She last used marijuana four days ago. She does not drink alcohol. Symptoms are moderate, constant, worsening. Past Medical History:  Diagnosis Date  . Chronic UTI   . Depression   . Drug abuse, marijuana   . Hyperemesis   . Migraine     Patient Active Problem List   Diagnosis Date Noted  . Encounter to establish care 09/14/2017  . Tobacco use disorder 09/14/2017  . Marijuana use 09/14/2017  . Cyclical vomiting associated with migraine 10/28/2016  . Depression 10/28/2016  . Persistent proteinuria 10/28/2016  . Flank pain 10/28/2016  . Hypokalemia 10/28/2016    History reviewed. No pertinent surgical history.   OB History   None      Home Medications    Prior to Admission medications   Medication Sig Start Date End Date Taking? Authorizing Provider  cetirizine-pseudoephedrine (ZYRTEC-D) 5-120 MG tablet Take 1 tablet by mouth daily. 09/12/17   Cathie Hoops, Amy V, PA-C  chlorproMAZINE (THORAZINE) 25 MG tablet Take 1 tablet (25 mg total) by mouth 3 (three) times daily as needed for nausea or vomiting. 06/27/17   Gwyneth Sprout, MD  FLUoxetine (PROZAC) 20 MG capsule Take 20 mg by mouth daily.     [provider]  fluticasone (FLONASE) 50 MCG/ACT nasal spray Place 2 sprays into both nostrils daily. 09/12/17   Cathie Hoops, Amy V, PA-C  ipratropium (ATROVENT) 0.06 % nasal spray Place 2 sprays into both nostrils 4 (four) times daily. 09/12/17   Cathie Hoops, Amy V, PA-C  meloxicam (MOBIC) 7.5 MG tablet Take 1 tablet (7.5 mg total) by mouth daily. 09/12/17   Cathie Hoops, Amy V, PA-C  nicotine (NICODERM CQ - DOSED IN MG/24 HOURS) 14 mg/24hr patch Place 1 patch (14 mg total) onto the skin daily. 09/14/17   Bland, Scott, DO  ondansetron (ZOFRAN ODT) 4 MG disintegrating tablet Take 1 tablet (4 mg total) by mouth every 8 (eight) hours as needed for nausea or vomiting. 08/13/17   Little, Ambrose Finland, MD  ondansetron (ZOFRAN) 4 MG tablet Take 1 tablet (4 mg total) by mouth every 8 (eight) hours as needed for nausea or vomiting. 10/15/16   Tegeler, Canary Brim, MD  pantoprazole (PROTONIX) 40 MG tablet Take 1 tablet (40 mg total) by mouth daily. 10/30/16   Arnetha Courser, MD  promethazine (PHENERGAN) 25 MG suppository Place 1 suppository (25 mg total) rectally every 6 (six) hours as needed for nausea or vomiting. 06/27/17   Gwyneth Sprout, MD  promethazine (PHENERGAN) 25 MG suppository Place 1 suppository (25 mg total) rectally every 6 (six) hours as needed for nausea or vomiting. 10/01/17   Tegeler, Canary Brim, MD  promethazine (PHENERGAN) 25 MG tablet Take 1 tablet (25 mg  total) by mouth every 8 (eight) hours as needed for nausea or vomiting. 11/18/17   Tilden Fossa, MD  saccharomyces boulardii (FLORASTOR) 250 MG capsule Take 1 capsule (250 mg total) by mouth 2 (two) times daily. 04/26/17   Meredeth Ide, MD    Family History Family History  Problem Relation Age of Onset  . Healthy Mother     Social History Social History   Tobacco Use  . Smoking status: Current Every Day Smoker    Packs/day: 0.50    Types: Cigarettes  . Smokeless tobacco: Never Used  Substance Use Topics  . Alcohol use: Yes    Comment: occ    . Drug use: Yes    Types: Marijuana     Allergies   Cinnamon and Nickel   Review of Systems Review of Systems  All other systems reviewed and are negative.    Physical Exam Updated Vital Signs BP 131/89 (BP Location: Right Arm)   Pulse 66   Temp (!) 97.5 F (36.4 C)   Resp 16   SpO2 100%   Physical Exam  Constitutional: She is oriented to person, place, and time. She appears well-developed and well-nourished.  HENT:  Head: Normocephalic and atraumatic.  Cardiovascular: Normal rate and regular rhythm.  No murmur heard. Pulmonary/Chest: Effort normal and breath sounds normal. No respiratory distress.  Abdominal: Soft. There is no rebound and no guarding.  Moderate epigastric tenderness  Musculoskeletal: She exhibits no edema or tenderness.  Neurological: She is alert and oriented to person, place, and time.  Skin: Skin is warm.  clammy  Psychiatric: Her behavior is normal.  Anxious appearing  Nursing note and vitals reviewed.    ED Treatments / Results  Labs (all labs ordered are listed, but only abnormal results are displayed) Labs Reviewed  COMPREHENSIVE METABOLIC PANEL - Abnormal; Notable for the following components:      Result Value   Potassium 3.3 (*)    CO2 16 (*)    Glucose, Bld 204 (*)    ALT 13 (*)    All other components within normal limits  CBC WITH DIFFERENTIAL/PLATELET - Abnormal; Notable for the following components:   WBC 14.3 (*)    MCHC 36.6 (*)    Neutro Abs 10.2 (*)    All other components within normal limits  LIPASE, BLOOD    EKG EKG Interpretation  Date/Time:  Saturday Nov 18 2017 07:41:00 EDT Ventricular Rate:  83 PR Interval:    QRS Duration: 113 QT Interval:  398 QTC Calculation: 468 R Axis:   79 Text Interpretation:  Sinus rhythm Borderline intraventricular conduction delay RSR' in V1 or V2, right VCD or RVH Confirmed by Tilden Fossa [phone]) on 11/18/2017 7:43:39 AM Also confirmed by Tilden Fossa [phone]), editor  Madalyn Rob [phone])  on 11/18/2017 8:10:08 AM   Radiology No results found.  Procedures Procedures (including critical care time)  Medications Ordered in ED Medications  sodium chloride 0.9 % bolus 1,000 mL (0 mLs Intravenous Stopped 11/18/17 0943)  haloperidol lactate (HALDOL) injection 2 mg (2 mg Intravenous Given 11/18/17 0759)  diphenhydrAMINE (BENADRYL) injection 25 mg (25 mg Intravenous Given 11/18/17 0759)  metoCLOPramide (REGLAN) injection 10 mg (10 mg Intravenous Given 11/18/17 0759)  LORazepam (ATIVAN) injection 1 mg (1 mg Intravenous Given 11/18/17 0943)  famotidine (PEPCID) IVPB 20 mg premix (0 mg Intravenous Stopped 11/18/17 1020)     Initial Impression / Assessment and Plan / ED Course  I have reviewed the triage vital signs and  the nursing notes.  Pertinent labs & imaging results that were available during my care of the patient were reviewed by me and considered in my medical decision making (see chart for details).    patient with history of marijuana abuse here for evaluation of nausea, vomiting, diarrhea and abdominal pain. She had no recurrent vomiting in the emergency department. She was treated with IV fluid hydration as well as antiemetic. Current presentation is not consistent with cholecystitis, appendicitis, perforated viscous. Counseled patient on discontinuing cannabis use as this may be contributing to her anxiety as well as her hyper emesis. Discussed importance of PCP/G.I. follow-up as well as psychiatry. Close return precautions discussed as well.  On repeat assessment in the department she is feeling improved with a soft and nontender abdomen.  Final Clinical Impressions(s) / ED Diagnoses   Final diagnoses:  Nausea vomiting and diarrhea  Hypokalemia    ED Discharge Orders        Ordered    promethazine (PHENERGAN) 25 MG tablet  Every 8 hours PRN     11/18/17 1042       Tilden Fossa, MD 11/18/17 1530

## 2017-11-18 NOTE — ED Notes (Signed)
ED MD informed that med has not helped her nausea . MD will reassess

## 2017-11-18 NOTE — ED Notes (Signed)
Taking po fluids without difficulty

## 2017-11-18 NOTE — ED Notes (Signed)
Walked up to nurses's station and informed nurse she is about to get "lock jaw" and needs some medicine for this. Informed that MD will be in shortly to discuss further medicine

## 2017-11-20 ENCOUNTER — Other Ambulatory Visit: Payer: Self-pay

## 2017-11-20 ENCOUNTER — Encounter (HOSPITAL_BASED_OUTPATIENT_CLINIC_OR_DEPARTMENT_OTHER): Payer: Self-pay | Admitting: *Deleted

## 2017-11-20 ENCOUNTER — Emergency Department (HOSPITAL_BASED_OUTPATIENT_CLINIC_OR_DEPARTMENT_OTHER)
Admission: EM | Admit: 2017-11-20 | Discharge: 2017-11-20 | Disposition: A | Discharge status: Home / Self Care | Payer: Medicaid Other | Attending: Emergency Medicine | Admitting: Emergency Medicine

## 2017-11-20 DIAGNOSIS — G43A0 Cyclical vomiting, not intractable: Secondary | ICD-10-CM | POA: Insufficient documentation

## 2017-11-20 DIAGNOSIS — R1115 Cyclical vomiting syndrome unrelated to migraine: Secondary | ICD-10-CM

## 2017-11-20 DIAGNOSIS — R1110 Vomiting, unspecified: Secondary | ICD-10-CM | POA: Diagnosis present

## 2017-11-20 DIAGNOSIS — Z79899 Other long term (current) drug therapy: Secondary | ICD-10-CM | POA: Insufficient documentation

## 2017-11-20 DIAGNOSIS — E876 Hypokalemia: Secondary | ICD-10-CM | POA: Diagnosis not present

## 2017-11-20 DIAGNOSIS — F17210 Nicotine dependence, cigarettes, uncomplicated: Secondary | ICD-10-CM | POA: Insufficient documentation

## 2017-11-20 LAB — COMPREHENSIVE METABOLIC PANEL
ALT: 12 U/L — AB (ref 14–54)
AST: 22 U/L (ref 15–41)
Albumin: 5.1 g/dL — ABNORMAL HIGH (ref 3.5–5.0)
Alkaline Phosphatase: 54 U/L (ref 38–126)
Anion gap: 17 — ABNORMAL HIGH (ref 5–15)
BUN: 13 mg/dL (ref 6–20)
CHLORIDE: 108 mmol/L (ref 101–111)
CO2: 15 mmol/L — ABNORMAL LOW (ref 22–32)
CREATININE: 0.83 mg/dL (ref 0.44–1.00)
Calcium: 9.4 mg/dL (ref 8.9–10.3)
GFR calc Af Amer: >60 mL/min (ref >60)
GLUCOSE: 134 mg/dL — AB (ref 65–99)
POTASSIUM: 2.7 mmol/L — AB (ref 3.5–5.1)
Sodium: 140 mmol/L (ref 135–145)
Total Bilirubin: 1.3 mg/dL — ABNORMAL HIGH (ref 0.3–1.2)
Total Protein: 7.5 g/dL (ref 6.5–8.1)

## 2017-11-20 LAB — URINALYSIS, ROUTINE W REFLEX MICROSCOPIC
GLUCOSE, UA: NEGATIVE mg/dL
HGB URINE DIPSTICK: NEGATIVE
Ketones, ur: 40 mg/dL — AB
LEUKOCYTES UA: NEGATIVE
Nitrite: NEGATIVE
PROTEIN: NEGATIVE mg/dL
pH: 5.5 (ref 5.0–8.0)

## 2017-11-20 LAB — CBC WITH DIFFERENTIAL/PLATELET
Basophils Absolute: 0 10*3/uL (ref 0.0–0.1)
Basophils Relative: 0 %
EOS ABS: 0.1 10*3/uL (ref 0.0–0.7)
EOS PCT: 1 %
HCT: 39.1 % (ref 36.0–46.0)
Hemoglobin: 14.3 g/dL (ref 12.0–15.0)
LYMPHS ABS: 3.3 10*3/uL (ref 0.7–4.0)
LYMPHS PCT: 25 %
MCH: 33.1 pg (ref 26.0–34.0)
MCHC: 36.6 g/dL — ABNORMAL HIGH (ref 30.0–36.0)
MCV: 90.5 fL (ref 78.0–100.0)
MONO ABS: 0.9 10*3/uL (ref 0.1–1.0)
MONOS PCT: 7 %
Neutro Abs: 8.8 10*3/uL — ABNORMAL HIGH (ref 1.7–7.7)
Neutrophils Relative %: 67 %
PLATELETS: 229 10*3/uL (ref 150–400)
RBC: 4.32 MIL/uL (ref 3.87–5.11)
RDW: 11.8 % (ref 11.5–15.5)
WBC: 13.1 10*3/uL — AB (ref 4.0–10.5)

## 2017-11-20 LAB — PREGNANCY, URINE: PREG TEST UR: NEGATIVE

## 2017-11-20 MED ORDER — LORAZEPAM 2 MG/ML IJ SOLN
1.0000 mg | Freq: Once | INTRAMUSCULAR | Status: AC
  Administered 2017-11-20: 1 mg via INTRAVENOUS
  Filled 2017-11-20: qty 1

## 2017-11-20 MED ORDER — SODIUM CHLORIDE 0.9 % IV BOLUS
1000.0000 mL | Freq: Once | INTRAVENOUS | Status: AC
  Administered 2017-11-20: 1000 mL via INTRAVENOUS

## 2017-11-20 MED ORDER — POTASSIUM CHLORIDE 10 MEQ/100ML IV SOLN
10.0000 meq | INTRAVENOUS | Status: AC
  Administered 2017-11-20 (×2): 10 meq via INTRAVENOUS
  Filled 2017-11-20 (×2): qty 100

## 2017-11-20 MED ORDER — HALOPERIDOL LACTATE 5 MG/ML IJ SOLN
2.0000 mg | Freq: Once | INTRAMUSCULAR | Status: DC
  Filled 2017-11-20: qty 1

## 2017-11-20 MED ORDER — POTASSIUM CHLORIDE CRYS ER 20 MEQ PO TBCR: 20.0000 meq | EXTENDED_RELEASE_TABLET | Freq: Every day | ORAL | 0 refills | Status: DC

## 2017-11-20 MED ORDER — FAMOTIDINE IN NACL 20-0.9 MG/50ML-% IV SOLN
20.0000 mg | Freq: Once | INTRAVENOUS | Status: AC
  Administered 2017-11-20: 20 mg via INTRAVENOUS
  Filled 2017-11-20: qty 50

## 2017-11-20 MED ORDER — POTASSIUM CHLORIDE CRYS ER 20 MEQ PO TBCR
40.0000 meq | EXTENDED_RELEASE_TABLET | Freq: Once | ORAL | Status: AC
  Administered 2017-11-20: 40 meq via ORAL
  Filled 2017-11-20: qty 2

## 2017-11-20 MED ORDER — ONDANSETRON HCL 4 MG/2ML IJ SOLN
4.0000 mg | Freq: Once | INTRAMUSCULAR | Status: AC
  Administered 2017-11-20: 4 mg via INTRAVENOUS
  Filled 2017-11-20: qty 2

## 2017-11-20 NOTE — ED Notes (Signed)
Pt triaged by this RN 

## 2017-11-20 NOTE — ED Notes (Signed)
ED Provider at bedside. 

## 2017-11-20 NOTE — ED Triage Notes (Signed)
N/V/D Seen here 2 days ago for same. Told it was because she was smoking pot, but states she has not smoked in 4-5 days. C/O epigastric pain.

## 2017-11-20 NOTE — ED Provider Notes (Signed)
[facility] EMERGENCY DEPARTMENT Provider Note   CSN: [id number] Arrival date & time: 11/20/17  0543     History   Chief Complaint Chief Complaint  Patient presents with  . Emesis    HPI Tiffany Williamson is a 22 y.o. female.  HPI  This is a 22 year old female with a history of hyperemesis thought to be related to marijuana abuse, chronic UTIs who presents with nausea, vomiting, diarrhea, abdominal pain, back pain.  Patient reports ongoing symptoms since being evaluated 2 days ago.  She states that this feels different than her hyperemesis.  She reports that she has not smoked marijuana in 7 to 8 days.  She reports ongoing nonbilious, nonbloody emesis and nonbloody diarrhea.  She reports epigastric burning pain that is nonradiating.  Additionally she reports back pain that lateralizes to the right.  She feels she may have a UTI.  She denies frequency, urgency, dysuria.  She denies fevers.  Patient was prescribed Phenergan but never got it filled.  She has not taken anything for her symptoms.  Past Medical History:  Diagnosis Date  . Chronic UTI   . Depression   . Drug abuse, marijuana   . Hyperemesis   . Migraine     Patient Active Problem List   Diagnosis Date Noted  . Encounter to establish care 09/14/2017  . Tobacco use disorder 09/14/2017  . Marijuana use 09/14/2017  . Cyclical vomiting associated with migraine 10/28/2016  . Depression 10/28/2016  . Persistent proteinuria 10/28/2016  . Flank pain 10/28/2016  . Hypokalemia 10/28/2016    History reviewed. No pertinent surgical history.   OB History   None      Home Medications    Prior to Admission medications   Medication Sig Start Date End Date Taking? Authorizing Provider  cetirizine-pseudoephedrine (ZYRTEC-D) 5-120 MG tablet Take 1 tablet by mouth daily. 09/12/17   Cathie Hoops, Amy V, PA-C  chlorproMAZINE (THORAZINE) 25 MG tablet Take 1 tablet (25 mg total) by mouth 3 (three) times daily as needed for  nausea or vomiting. 06/27/17   Gwyneth Sprout, MD  FLUoxetine (PROZAC) 20 MG capsule Take 20 mg by mouth daily.    [provider]  fluticasone (FLONASE) 50 MCG/ACT nasal spray Place 2 sprays into both nostrils daily. 09/12/17   Cathie Hoops, Amy V, PA-C  ipratropium (ATROVENT) 0.06 % nasal spray Place 2 sprays into both nostrils 4 (four) times daily. 09/12/17   Cathie Hoops, Amy V, PA-C  meloxicam (MOBIC) 7.5 MG tablet Take 1 tablet (7.5 mg total) by mouth daily. 09/12/17   Cathie Hoops, Amy V, PA-C  nicotine (NICODERM CQ - DOSED IN MG/24 HOURS) 14 mg/24hr patch Place 1 patch (14 mg total) onto the skin daily. 09/14/17   Bland, Scott, DO  ondansetron (ZOFRAN ODT) 4 MG disintegrating tablet Take 1 tablet (4 mg total) by mouth every 8 (eight) hours as needed for nausea or vomiting. 08/13/17   Little, Ambrose Finland, MD  ondansetron (ZOFRAN) 4 MG tablet Take 1 tablet (4 mg total) by mouth every 8 (eight) hours as needed for nausea or vomiting. 10/15/16   Tegeler, Canary Brim, MD  pantoprazole (PROTONIX) 40 MG tablet Take 1 tablet (40 mg total) by mouth daily. 10/30/16   Arnetha Courser, MD  promethazine (PHENERGAN) 25 MG suppository Place 1 suppository (25 mg total) rectally every 6 (six) hours as needed for nausea or vomiting. 06/27/17   Gwyneth Sprout, MD  promethazine (PHENERGAN) 25 MG suppository Place 1 suppository (25 mg total) rectally every  6 (six) hours as needed for nausea or vomiting. 10/01/17   Tegeler, Canary Brim, MD  promethazine (PHENERGAN) 25 MG tablet Take 1 tablet (25 mg total) by mouth every 8 (eight) hours as needed for nausea or vomiting. 11/18/17   Tilden Fossa, MD  saccharomyces boulardii (FLORASTOR) 250 MG capsule Take 1 capsule (250 mg total) by mouth 2 (two) times daily. 04/26/17   Meredeth Ide, MD    Family History Family History  Problem Relation Age of Onset  . Healthy Mother     Social History Social History   Tobacco Use  . Smoking status: Current Every Day Smoker    Packs/day: 0.50     Types: Cigarettes  . Smokeless tobacco: Never Used  Substance Use Topics  . Alcohol use: Yes    Comment: occ  . Drug use: Yes    Types: Marijuana     Allergies   Cinnamon and Nickel   Review of Systems Review of Systems  Constitutional: Negative for fever.  Respiratory: Negative for shortness of breath.   Cardiovascular: Negative for chest pain.  Gastrointestinal: Positive for abdominal pain, diarrhea, nausea and vomiting.  Genitourinary: Positive for flank pain. Negative for dysuria.  Neurological: Negative for headaches.  All other systems reviewed and are negative.    Physical Exam Updated Vital Signs BP (!) 135/91 (BP Location: Right Arm)   Pulse 72   Temp (!) 97.5 F (36.4 C) (Oral)   Resp (!) 28   Ht  (1.6 m)   Wt 45.4 kg (100 lb)   SpO2 99%   BMI 17.71 kg/m   Physical Exam  Constitutional: She is oriented to person, place, and time. She appears well-developed and well-nourished.  HENT:  Head: Normocephalic and atraumatic.  Mucous membranes moist  Eyes: Pupils are equal, round, and reactive to light.  Cardiovascular: Normal rate, regular rhythm and normal heart sounds.  Pulmonary/Chest: Effort normal and breath sounds normal. No respiratory distress. She has no wheezes.  Abdominal: Soft. Bowel sounds are normal. There is no tenderness. There is no rebound and no guarding.  No CVA tenderness  Neurological: She is alert and oriented to person, place, and time.  Skin: Skin is warm and dry.  Psychiatric: She has a normal mood and affect.  Very anxious appearing  Nursing note and vitals reviewed.    ED Treatments / Results  Labs (all labs ordered are listed, but only abnormal results are displayed) Labs Reviewed  URINALYSIS, ROUTINE W REFLEX MICROSCOPIC  PREGNANCY, URINE    EKG None  Radiology No results found.  Procedures Procedures (including critical care time)  Medications Ordered in ED Medications  haloperidol lactate (HALDOL)  injection 2 mg (has no administration in time range)  sodium chloride 0.9 % bolus 1,000 mL (has no administration in time range)     Initial Impression / Assessment and Plan / ED Course  I have reviewed the triage vital signs and the nursing notes.  Pertinent labs & imaging results that were available during my care of the patient were reviewed by me and considered in my medical decision making (see chart for details).     Patient presents with persistent nausea, vomiting, diarrhea, abdominal pain.  She is overall nontoxic and vital signs are reassuring.  Exam is fairly benign.  She does not appear significantly dehydrated.  She is very anxious appearing.  Urine is pending.  Patient was given fluids.  I have reviewed her chart from 2 days ago.  She had  fairly reassuring labs at that time.  At this time, will repeat urine but will hold off any further lab work.  Initially Haldol was ordered for her nausea and vomiting given her anxiety.  However, she declined.  Patient was given Zofran.  Final Clinical Impressions(s) / ED Diagnoses   Final diagnoses:  None    ED Discharge Orders    None       Horton, Mayer Masker, MD 11/20/17 [phone]

## 2017-11-20 NOTE — ED Notes (Signed)
Pt ambulated to restroom. Pt reported she needed to have a bowel movement. Pt offered urine cup but stated she could not get one yet.

## 2017-11-20 NOTE — ED Provider Notes (Signed)
07:45 care was taken over from Dr. Wilkie Aye.  Patient has a history of chronic marijuana use and has recurrent episodes of nausea vomiting associated with abdominal pain.  She is still feeling nauseated.  She is very anxious, will try Ativan and Pepcid.  She does have some abdominal pain but it seems to be localized to her epigastric area.  8:15 patient's urinalysis resulted.  She does appear to be fairly dehydrated.  Will give another liter of fluid.  She also had brown urine, given this I will check some labs to make sure that they have not changed significantly in the last couple days.  13:02 patient's potassium was low on her labs.  She was given both oral and IV potassium replacement.  She was feeling much better.  She is able to tolerate oral fluids.  She has a benign abdominal exam.  She was discharged home in good condition.  She was encouraged to follow-up with her PCP.  She was given a 3-day course of potassium.  Return precautions were given.  She has a prescription for Phenergan to use at home.   Rolan Bucco, MD 11/20/17 [phone]

## 2017-11-20 NOTE — ED Notes (Signed)
Pt reports she thinks the potassium she is receiving via IV is making her have a panic attack. RN will notify MD. Pt has hisoty of anxiety attack.

## 2017-11-20 NOTE — ED Notes (Signed)
Pt has been drinking sprite for PO Challenge

## 2017-12-10 ENCOUNTER — Emergency Department (HOSPITAL_BASED_OUTPATIENT_CLINIC_OR_DEPARTMENT_OTHER)
Admission: EM | Admit: 2017-12-10 | Discharge: 2017-12-10 | Disposition: A | Discharge status: Home / Self Care | Payer: Medicaid Other | Attending: Emergency Medicine | Admitting: Emergency Medicine

## 2017-12-10 ENCOUNTER — Emergency Department (HOSPITAL_BASED_OUTPATIENT_CLINIC_OR_DEPARTMENT_OTHER): Payer: Medicaid Other

## 2017-12-10 ENCOUNTER — Encounter (HOSPITAL_BASED_OUTPATIENT_CLINIC_OR_DEPARTMENT_OTHER): Payer: Self-pay | Admitting: Emergency Medicine

## 2017-12-10 ENCOUNTER — Other Ambulatory Visit: Payer: Self-pay

## 2017-12-10 DIAGNOSIS — F17210 Nicotine dependence, cigarettes, uncomplicated: Secondary | ICD-10-CM | POA: Insufficient documentation

## 2017-12-10 DIAGNOSIS — R112 Nausea with vomiting, unspecified: Secondary | ICD-10-CM | POA: Diagnosis present

## 2017-12-10 DIAGNOSIS — Z79899 Other long term (current) drug therapy: Secondary | ICD-10-CM | POA: Diagnosis not present

## 2017-12-10 DIAGNOSIS — R1115 Cyclical vomiting syndrome unrelated to migraine: Secondary | ICD-10-CM

## 2017-12-10 LAB — URINALYSIS, ROUTINE W REFLEX MICROSCOPIC
BILIRUBIN URINE: NEGATIVE
Glucose, UA: NEGATIVE mg/dL
Ketones, ur: >80 mg/dL — AB
NITRITE: NEGATIVE
PROTEIN: 100 mg/dL — AB
Specific Gravity, Urine: >1.03 — ABNORMAL HIGH (ref 1.005–1.030)
pH: 6 (ref 5.0–8.0)

## 2017-12-10 LAB — CBC WITH DIFFERENTIAL/PLATELET
BASOS PCT: 0 %
Basophils Absolute: 0 10*3/uL (ref 0.0–0.1)
EOS ABS: 0 10*3/uL (ref 0.0–0.7)
EOS PCT: 0 %
HCT: 42 % (ref 36.0–46.0)
Hemoglobin: 15.6 g/dL — ABNORMAL HIGH (ref 12.0–15.0)
LYMPHS ABS: 1 10*3/uL (ref 0.7–4.0)
Lymphocytes Relative: 6 %
MCH: 32.4 pg (ref 26.0–34.0)
MCHC: 37.1 g/dL — AB (ref 30.0–36.0)
MCV: 87.1 fL (ref 78.0–100.0)
MONOS PCT: 5 %
Monocytes Absolute: 0.9 10*3/uL (ref 0.1–1.0)
Neutro Abs: 15.9 10*3/uL — ABNORMAL HIGH (ref 1.7–7.7)
Neutrophils Relative %: 89 %
PLATELETS: 256 10*3/uL (ref 150–400)
RBC: 4.82 MIL/uL (ref 3.87–5.11)
RDW: 11.7 % (ref 11.5–15.5)
WBC: 17.8 10*3/uL — AB (ref 4.0–10.5)

## 2017-12-10 LAB — COMPREHENSIVE METABOLIC PANEL
ALK PHOS: 69 U/L (ref 38–126)
ALT: 15 U/L (ref 14–54)
ANION GAP: 18 — AB (ref 5–15)
AST: 31 U/L (ref 15–41)
Albumin: 5.4 g/dL — ABNORMAL HIGH (ref 3.5–5.0)
BILIRUBIN TOTAL: 1 mg/dL (ref 0.3–1.2)
BUN: 17 mg/dL (ref 6–20)
CALCIUM: 10.5 mg/dL — AB (ref 8.9–10.3)
CO2: 19 mmol/L — ABNORMAL LOW (ref 22–32)
CREATININE: 0.81 mg/dL (ref 0.44–1.00)
Chloride: 104 mmol/L (ref 101–111)
GFR calc non Af Amer: >60 mL/min (ref >60)
Glucose, Bld: 141 mg/dL — ABNORMAL HIGH (ref 65–99)
Potassium: 3.6 mmol/L (ref 3.5–5.1)
SODIUM: 141 mmol/L (ref 135–145)
TOTAL PROTEIN: 8.6 g/dL — AB (ref 6.5–8.1)

## 2017-12-10 LAB — URINALYSIS, MICROSCOPIC (REFLEX)

## 2017-12-10 LAB — PREGNANCY, URINE: PREG TEST UR: NEGATIVE

## 2017-12-10 LAB — LIPASE, BLOOD: Lipase: 22 U/L (ref 11–51)

## 2017-12-10 MED ORDER — ONDANSETRON HCL 4 MG/2ML IJ SOLN
4.0000 mg | Freq: Once | INTRAMUSCULAR | Status: AC
  Administered 2017-12-10: 4 mg via INTRAVENOUS
  Filled 2017-12-10: qty 2

## 2017-12-10 MED ORDER — SODIUM CHLORIDE 0.9 % IV BOLUS
1000.0000 mL | Freq: Once | INTRAVENOUS | Status: AC
  Administered 2017-12-10: 1000 mL via INTRAVENOUS

## 2017-12-10 MED ORDER — METOCLOPRAMIDE HCL 5 MG/ML IJ SOLN
10.0000 mg | Freq: Once | INTRAMUSCULAR | Status: AC
  Administered 2017-12-10: 10 mg via INTRAVENOUS
  Filled 2017-12-10: qty 2

## 2017-12-10 MED ORDER — FAMOTIDINE IN NACL 20-0.9 MG/50ML-% IV SOLN
20.0000 mg | Freq: Once | INTRAVENOUS | Status: AC
  Administered 2017-12-10: 20 mg via INTRAVENOUS
  Filled 2017-12-10: qty 50

## 2017-12-10 MED ORDER — PROMETHAZINE HCL 25 MG RE SUPP: 25.0000 mg | Freq: Four times a day (QID) | RECTAL | 0 refills | Status: DC | PRN

## 2017-12-10 NOTE — ED Triage Notes (Signed)
Vomiting and abd pain x 13 hours. Frequent hx of same.

## 2017-12-10 NOTE — Discharge Instructions (Signed)
If you develop recurrent vomiting, worsening abdominal pain, fevers, or other new/concerning symptoms then return to the ER.  Otherwise follow-up with your primary care physician.  I highly encourage you to stop using marijuana completely.  If you think there are other inciting factors such as milk and he should also stop this.

## 2017-12-10 NOTE — ED Provider Notes (Signed)
[facility] EMERGENCY DEPARTMENT Provider Note   CSN: [id number] Arrival date & time: 12/10/17  1354     History   Chief Complaint Chief Complaint  Patient presents with  . Abdominal Pain    HPI Tiffany Williamson is a 22 y.o. female.  HPI  22 year old female presents with recurrent vomiting.  She has a history of hyperemesis.  She states is been attributed to marijuana use but does not think that this is accurate as she has not had marijuana in 1 week and now is having recurrent symptoms.  She states that before this she is been using marijuana multiple times per week but not daily.  She thinks it might be related to a milk allergy.  Since around 1 AM she had multiple episodes of vomiting and diarrhea.  No obvious blood.  She is also having some burning in her chest and some pain in her chest when taking deep breaths. The upper abdominal pain and burning is very similar to prior.  She feels like the epigastric pain is a little worse than it typically is but otherwise this presentation is very similar to multiple prior per her.  No fevers or urinary symptoms.  She tried Phenergan suppository without relief. She asks for haldol not to be given as it causes her to get palpitations. She states that the pepcid given last time significantly helped.  Past Medical History:  Diagnosis Date  . Chronic UTI   . Depression   . Drug abuse, marijuana   . Hyperemesis   . Migraine     Patient Active Problem List   Diagnosis Date Noted  . Encounter to establish care 09/14/2017  . Tobacco use disorder 09/14/2017  . Marijuana use 09/14/2017  . Cyclical vomiting associated with migraine 10/28/2016  . Depression 10/28/2016  . Persistent proteinuria 10/28/2016  . Flank pain 10/28/2016  . Hypokalemia 10/28/2016    History reviewed. No pertinent surgical history.   OB History   None      Home Medications    Prior to Admission medications   Medication Sig Start Date End Date  Taking? Authorizing Provider  cetirizine-pseudoephedrine (ZYRTEC-D) 5-120 MG tablet Take 1 tablet by mouth daily. 09/12/17   Cathie Hoops, Amy V, PA-C  chlorproMAZINE (THORAZINE) 25 MG tablet Take 1 tablet (25 mg total) by mouth 3 (three) times daily as needed for nausea or vomiting. 06/27/17   Gwyneth Sprout, MD  FLUoxetine (PROZAC) 20 MG capsule Take 20 mg by mouth daily.    [provider]  fluticasone (FLONASE) 50 MCG/ACT nasal spray Place 2 sprays into both nostrils daily. 09/12/17   Cathie Hoops, Amy V, PA-C  ipratropium (ATROVENT) 0.06 % nasal spray Place 2 sprays into both nostrils 4 (four) times daily. 09/12/17   Cathie Hoops, Amy V, PA-C  meloxicam (MOBIC) 7.5 MG tablet Take 1 tablet (7.5 mg total) by mouth daily. 09/12/17   Cathie Hoops, Amy V, PA-C  nicotine (NICODERM CQ - DOSED IN MG/24 HOURS) 14 mg/24hr patch Place 1 patch (14 mg total) onto the skin daily. 09/14/17   Bland, Jesus Nevills, DO  ondansetron (ZOFRAN ODT) 4 MG disintegrating tablet Take 1 tablet (4 mg total) by mouth every 8 (eight) hours as needed for nausea or vomiting. 08/13/17   Little, Ambrose Finland, MD  ondansetron (ZOFRAN) 4 MG tablet Take 1 tablet (4 mg total) by mouth every 8 (eight) hours as needed for nausea or vomiting. 10/15/16   Tegeler, Canary Brim, MD  pantoprazole (PROTONIX) 40 MG  tablet Take 1 tablet (40 mg total) by mouth daily. 10/30/16   Arnetha CourserAmin, Sumayya, MD  potassium chloride SA (K-DUR,KLOR-CON) 20 MEQ tablet Take 1 tablet (20 mEq total) by mouth daily. 11/20/17   Rolan BuccoBelfi, Melanie, MD  promethazine (PHENERGAN) 25 MG suppository Place 1 suppository (25 mg total) rectally every 6 (six) hours as needed for nausea or vomiting. 12/10/17   Pricilla LovelessGoldston, Harshil Cavallaro, MD  promethazine (PHENERGAN) 25 MG tablet Take 1 tablet (25 mg total) by mouth every 8 (eight) hours as needed for nausea or vomiting. 11/18/17   Tilden Fossaees, Elizabeth, MD  saccharomyces boulardii (FLORASTOR) 250 MG capsule Take 1 capsule (250 mg total) by mouth 2 (two) times daily. 04/26/17   Meredeth IdeLama, Gagan S, MD     Family History Family History  Problem Relation Age of Onset  . Healthy Mother     Social History Social History   Tobacco Use  . Smoking status: Current Every Day Smoker    Packs/day: 0.50    Types: Cigarettes  . Smokeless tobacco: Never Used  Substance Use Topics  . Alcohol use: Yes    Comment: occ  . Drug use: Yes    Types: Marijuana     Allergies   Cinnamon and Nickel   Review of Systems Review of Systems  Constitutional: Negative for fever.  Respiratory: Negative for shortness of breath.   Cardiovascular: Positive for chest pain.  Gastrointestinal: Positive for abdominal pain, diarrhea, nausea and vomiting. Negative for blood in stool.  Genitourinary: Negative for dysuria, hematuria, vaginal bleeding and vaginal discharge.  All other systems reviewed and are negative.    Physical Exam Updated Vital Signs BP 122/77 (BP Location: Left Arm)   Pulse 72   Temp 98.1 F (36.7 C) (Oral)   Resp 19   Ht 5\' 3"  (1.6 m)   Wt 45.4 kg (100 lb)   SpO2 100%   BMI 17.71 kg/m   Physical Exam  Constitutional: She is oriented to person, place, and time. She appears well-developed and well-nourished.  Non-toxic appearance. She does not appear ill. No distress.  HENT:  Head: Normocephalic and atraumatic.  Right Ear: External ear normal.  Left Ear: External ear normal.  Nose: Nose normal.  Eyes: Right eye exhibits no discharge. Left eye exhibits no discharge.  Cardiovascular: Normal rate, regular rhythm and normal heart sounds.  Pulmonary/Chest: Effort normal and breath sounds normal.  Abdominal: Soft. There is tenderness in the epigastric area.  Neurological: She is alert and oriented to person, place, and time.  Skin: Skin is warm and dry.  Nursing note and vitals reviewed.    ED Treatments / Results  Labs (all labs ordered are listed, but only abnormal results are displayed) Labs Reviewed  COMPREHENSIVE METABOLIC PANEL - Abnormal; Notable for the following  components:      Result Value   CO2 19 (*)    Glucose, Bld 141 (*)    Calcium 10.5 (*)    Total Protein 8.6 (*)    Albumin 5.4 (*)    Anion gap 18 (*)    All other components within normal limits  CBC WITH DIFFERENTIAL/PLATELET - Abnormal; Notable for the following components:   WBC 17.8 (*)    Hemoglobin 15.6 (*)    MCHC 37.1 (*)    Neutro Abs 15.9 (*)    All other components within normal limits  URINALYSIS, ROUTINE W REFLEX MICROSCOPIC - Abnormal; Notable for the following components:   Color, Urine AMBER (*)    Specific Gravity, Urine >  1.030 (*)    Hgb urine dipstick TRACE (*)    Ketones, ur >80 (*)    Protein, ur 100 (*)    Leukocytes, UA TRACE (*)    All other components within normal limits  URINALYSIS, MICROSCOPIC (REFLEX) - Abnormal; Notable for the following components:   Bacteria, UA RARE (*)    All other components within normal limits  LIPASE, BLOOD  PREGNANCY, URINE    EKG None  Radiology Dg Chest Portable 1 View  Result Date: 12/10/2017 CLINICAL DATA:  Generalized abdominal pain, vomiting and shortness of breath since early this morning. EXAM: PORTABLE CHEST 1 VIEW COMPARISON:  Chest x-ray dated 10/01/2017. FINDINGS: The heart size and mediastinal contours are within normal limits. Both lungs are clear. The visualized skeletal structures are unremarkable. IMPRESSION: Normal chest x-ray.  No evidence of pneumonia or pulmonary edema. Electronically Signed   By: Bary Richard M.D.   On: 12/10/2017 15:31    Procedures Procedures (including critical care time)  Medications Ordered in ED Medications  sodium chloride 0.9 % bolus 1,000 mL (0 mLs Intravenous Stopped 12/10/17 1801)  famotidine (PEPCID) IVPB 20 mg premix (0 mg Intravenous Stopped 12/10/17 1649)  metoCLOPramide (REGLAN) injection 10 mg (10 mg Intravenous Given 12/10/17 1531)  ondansetron (ZOFRAN) injection 4 mg (4 mg Intravenous Given 12/10/17 1650)     Initial Impression / Assessment and Plan / ED  Course  I have reviewed the triage vital signs and the nursing notes.  Pertinent labs & imaging results that were available during my care of the patient were reviewed by me and considered in my medical decision making (see chart for details).     Patient presents with recurrent vomiting and upper abdominal pain.  Given her chest pain, chest x-ray obtained but I think this is likely reflux related as she also indicates.  With Pepcid, Reglan, and Zofran she is feeling better.  She was given fluids.  She has persistent leukocytosis and mildly low bicarbonate but these are similar or somewhat improved from before.  Urinalysis not consistent with UTI and she has no symptoms.  Given she is feeling better and has been tolerating p.o. I think she stable for discharge home.  Follow-up with PCP.  Final Clinical Impressions(s) / ED Diagnoses   Final diagnoses:  Non-intractable cyclical vomiting with nausea    ED Discharge Orders        Ordered    promethazine (PHENERGAN) 25 MG suppository  Every 6 hours PRN     12/10/17 1747       Pricilla Loveless, MD 12/11/17 0001

## 2018-01-01 ENCOUNTER — Other Ambulatory Visit: Payer: Self-pay

## 2018-01-01 ENCOUNTER — Encounter (HOSPITAL_BASED_OUTPATIENT_CLINIC_OR_DEPARTMENT_OTHER): Payer: Self-pay | Admitting: Emergency Medicine

## 2018-01-01 ENCOUNTER — Emergency Department (HOSPITAL_BASED_OUTPATIENT_CLINIC_OR_DEPARTMENT_OTHER)
Admission: EM | Admit: 2018-01-01 | Discharge: 2018-01-01 | Disposition: A | Discharge status: Home / Self Care | Payer: Medicaid Other | Attending: Emergency Medicine | Admitting: Emergency Medicine

## 2018-01-01 DIAGNOSIS — R109 Unspecified abdominal pain: Secondary | ICD-10-CM | POA: Diagnosis present

## 2018-01-01 DIAGNOSIS — F17210 Nicotine dependence, cigarettes, uncomplicated: Secondary | ICD-10-CM | POA: Insufficient documentation

## 2018-01-01 DIAGNOSIS — R197 Diarrhea, unspecified: Secondary | ICD-10-CM | POA: Diagnosis not present

## 2018-01-01 DIAGNOSIS — R112 Nausea with vomiting, unspecified: Secondary | ICD-10-CM | POA: Insufficient documentation

## 2018-01-01 DIAGNOSIS — Z79899 Other long term (current) drug therapy: Secondary | ICD-10-CM | POA: Insufficient documentation

## 2018-01-01 DIAGNOSIS — E876 Hypokalemia: Secondary | ICD-10-CM | POA: Diagnosis not present

## 2018-01-01 LAB — COMPREHENSIVE METABOLIC PANEL
ALK PHOS: 59 U/L (ref 38–126)
ALT: 16 U/L (ref 0–44)
ANION GAP: 18 — AB (ref 5–15)
AST: 32 U/L (ref 15–41)
Albumin: 5.5 g/dL — ABNORMAL HIGH (ref 3.5–5.0)
BILIRUBIN TOTAL: 1.1 mg/dL (ref 0.3–1.2)
BUN: 13 mg/dL (ref 6–20)
CALCIUM: 9.8 mg/dL (ref 8.9–10.3)
CO2: 20 mmol/L — ABNORMAL LOW (ref 22–32)
Chloride: 100 mmol/L (ref 98–111)
Creatinine, Ser: 0.86 mg/dL (ref 0.44–1.00)
GFR calc non Af Amer: >60 mL/min (ref >60)
Glucose, Bld: 131 mg/dL — ABNORMAL HIGH (ref 70–99)
Potassium: 2.7 mmol/L — CL (ref 3.5–5.1)
SODIUM: 138 mmol/L (ref 135–145)
TOTAL PROTEIN: 8.3 g/dL — AB (ref 6.5–8.1)

## 2018-01-01 LAB — CBC WITH DIFFERENTIAL/PLATELET
BASOS ABS: 0 10*3/uL (ref 0.0–0.1)
BASOS PCT: 0 %
EOS ABS: 0 10*3/uL (ref 0.0–0.7)
Eosinophils Relative: 0 %
HCT: 43.4 % (ref 36.0–46.0)
HEMOGLOBIN: 16 g/dL — AB (ref 12.0–15.0)
Lymphocytes Relative: 13 %
Lymphs Abs: 1.4 10*3/uL (ref 0.7–4.0)
MCH: 32.3 pg (ref 26.0–34.0)
MCHC: 36.9 g/dL — ABNORMAL HIGH (ref 30.0–36.0)
MCV: 87.7 fL (ref 78.0–100.0)
Monocytes Absolute: 0.5 10*3/uL (ref 0.1–1.0)
Monocytes Relative: 5 %
NEUTROS PCT: 82 %
Neutro Abs: 8.5 10*3/uL — ABNORMAL HIGH (ref 1.7–7.7)
Platelets: 117 10*3/uL — ABNORMAL LOW (ref 150–400)
RBC: 4.95 MIL/uL (ref 3.87–5.11)
RDW: 12.1 % (ref 11.5–15.5)
WBC: 10.4 10*3/uL (ref 4.0–10.5)

## 2018-01-01 LAB — PREGNANCY, URINE: PREG TEST UR: NEGATIVE

## 2018-01-01 LAB — URINALYSIS, ROUTINE W REFLEX MICROSCOPIC
Glucose, UA: NEGATIVE mg/dL
Ketones, ur: >80 mg/dL — AB
Leukocytes, UA: NEGATIVE
Nitrite: NEGATIVE
Protein, ur: 30 mg/dL — AB
SPECIFIC GRAVITY, URINE: 1.02 (ref 1.005–1.030)
pH: 6 (ref 5.0–8.0)

## 2018-01-01 LAB — MAGNESIUM: Magnesium: 2 mg/dL (ref 1.7–2.4)

## 2018-01-01 LAB — LIPASE, BLOOD: Lipase: 24 U/L (ref 11–51)

## 2018-01-01 LAB — URINALYSIS, MICROSCOPIC (REFLEX)

## 2018-01-01 MED ORDER — DICYCLOMINE HCL 10 MG/ML IM SOLN
10.0000 mg | Freq: Once | INTRAMUSCULAR | Status: AC
  Administered 2018-01-01: 10 mg via INTRAMUSCULAR
  Filled 2018-01-01: qty 2

## 2018-01-01 MED ORDER — POTASSIUM CHLORIDE 10 MEQ/100ML IV SOLN
10.0000 meq | Freq: Once | INTRAVENOUS | Status: AC
  Administered 2018-01-01: 10 meq via INTRAVENOUS
  Filled 2018-01-01: qty 100

## 2018-01-01 MED ORDER — METOCLOPRAMIDE HCL 5 MG/ML IJ SOLN
10.0000 mg | Freq: Once | INTRAMUSCULAR | Status: AC
  Administered 2018-01-01: 10 mg via INTRAVENOUS
  Filled 2018-01-01: qty 2

## 2018-01-01 MED ORDER — FAMOTIDINE IN NACL 20-0.9 MG/50ML-% IV SOLN
20.0000 mg | Freq: Once | INTRAVENOUS | Status: AC
  Administered 2018-01-01: 20 mg via INTRAVENOUS
  Filled 2018-01-01: qty 50

## 2018-01-01 MED ORDER — METOCLOPRAMIDE HCL 10 MG PO TABS: 10.0000 mg | ORAL_TABLET | Freq: Three times a day (TID) | ORAL | 0 refills | Status: DC | PRN

## 2018-01-01 MED ORDER — SODIUM CHLORIDE 0.9 % IV BOLUS
1000.0000 mL | Freq: Once | INTRAVENOUS | Status: AC
  Administered 2018-01-01: 1000 mL via INTRAVENOUS

## 2018-01-01 MED ORDER — ONDANSETRON HCL 4 MG/2ML IJ SOLN
4.0000 mg | Freq: Once | INTRAMUSCULAR | Status: AC
  Administered 2018-01-01: 4 mg via INTRAVENOUS
  Filled 2018-01-01: qty 2

## 2018-01-01 MED ORDER — POTASSIUM CHLORIDE CRYS ER 20 MEQ PO TBCR: 20.0000 meq | EXTENDED_RELEASE_TABLET | Freq: Two times a day (BID) | ORAL | 0 refills | Status: DC

## 2018-01-01 MED FILL — METOCLOPRAMIDE 10 MG TABLET: 10 | 2 days supply | Qty: 8 | Fill #0

## 2018-01-01 MED FILL — POTASSIUM CL ER 20 MEQ TAB: 20 | 5 days supply | Qty: 10 | Fill #0

## 2018-01-01 NOTE — ED Notes (Signed)
Ambulatory to bathroom but reports unable to provide UA

## 2018-01-01 NOTE — ED Notes (Signed)
Patient reports some relief from nausea but continues to c/o stomach cramping.

## 2018-01-01 NOTE — ED Notes (Signed)
ED Provider at bedside. 

## 2018-01-01 NOTE — ED Provider Notes (Signed)
[facility] EMERGENCY DEPARTMENT Provider Note   CSN: [id number] Arrival date & time: 01/01/18  0747     History   Chief Complaint Chief Complaint  Patient presents with  . Abdominal Pain    HPI Tiffany Williamson is a 22 y.o. female.  HPI Patient presents with abdominal pain nausea vomiting diarrhea.  Began yesterday.  Upper abdominal pain.  Has had episodes like in the past.  Had been some thought that it was related to marijuana but states she has not used over the last couple months.  Does have history of hyperemesis.  No fevers.  States she has had some chills.  No sick contacts.  States she has Phenergan suppositories but is having diarrhea.  Denies pregnancy. Past Medical History:  Diagnosis Date  . Chronic UTI   . Depression   . Drug abuse, marijuana   . Hyperemesis   . Migraine     Patient Active Problem List   Diagnosis Date Noted  . Encounter to establish care 09/14/2017  . Tobacco use disorder 09/14/2017  . Marijuana use 09/14/2017  . Cyclical vomiting associated with migraine 10/28/2016  . Depression 10/28/2016  . Persistent proteinuria 10/28/2016  . Flank pain 10/28/2016  . Hypokalemia 10/28/2016    History reviewed. No pertinent surgical history.   OB History   None      Home Medications    Prior to Admission medications   Medication Sig Start Date End Date Taking? Authorizing Provider  cetirizine-pseudoephedrine (ZYRTEC-D) 5-120 MG tablet Take 1 tablet by mouth daily. 09/12/17   Cathie Hoops, Amy V, PA-C  chlorproMAZINE (THORAZINE) 25 MG tablet Take 1 tablet (25 mg total) by mouth 3 (three) times daily as needed for nausea or vomiting. 06/27/17   Gwyneth Sprout, MD  FLUoxetine (PROZAC) 20 MG capsule Take 20 mg by mouth daily.    [provider]  fluticasone (FLONASE) 50 MCG/ACT nasal spray Place 2 sprays into both nostrils daily. 09/12/17   Cathie Hoops, Amy V, PA-C  ipratropium (ATROVENT) 0.06 % nasal spray Place 2 sprays into both nostrils 4  (four) times daily. 09/12/17   Cathie Hoops, Amy V, PA-C  meloxicam (MOBIC) 7.5 MG tablet Take 1 tablet (7.5 mg total) by mouth daily. 09/12/17   Cathie Hoops, Amy V, PA-C  metoCLOPramide (REGLAN) 10 MG tablet Take 1 tablet (10 mg total) by mouth every 8 (eight) hours as needed for nausea. 01/01/18   Benjiman Core, MD  nicotine (NICODERM CQ - DOSED IN MG/24 HOURS) 14 mg/24hr patch Place 1 patch (14 mg total) onto the skin daily. 09/14/17   Bland, Scott, DO  ondansetron (ZOFRAN ODT) 4 MG disintegrating tablet Take 1 tablet (4 mg total) by mouth every 8 (eight) hours as needed for nausea or vomiting. 08/13/17   Little, Ambrose Finland, MD  ondansetron (ZOFRAN) 4 MG tablet Take 1 tablet (4 mg total) by mouth every 8 (eight) hours as needed for nausea or vomiting. 10/15/16   Tegeler, Canary Brim, MD  pantoprazole (PROTONIX) 40 MG tablet Take 1 tablet (40 mg total) by mouth daily. 10/30/16   Arnetha Courser, MD  potassium chloride SA (K-DUR,KLOR-CON) 20 MEQ tablet Take 1 tablet (20 mEq total) by mouth 2 (two) times daily. 01/01/18   Benjiman Core, MD  promethazine (PHENERGAN) 25 MG suppository Place 1 suppository (25 mg total) rectally every 6 (six) hours as needed for nausea or vomiting. 12/10/17   Pricilla Loveless, MD  promethazine (PHENERGAN) 25 MG tablet Take 1 tablet (25 mg total) by mouth  every 8 (eight) hours as needed for nausea or vomiting. 11/18/17   Tilden Fossa, MD  saccharomyces boulardii (FLORASTOR) 250 MG capsule Take 1 capsule (250 mg total) by mouth 2 (two) times daily. 04/26/17   Meredeth Ide, MD    Family History Family History  Problem Relation Age of Onset  . Healthy Mother     Social History Social History   Tobacco Use  . Smoking status: Current Every Day Smoker    Packs/day: 0.50    Types: Cigarettes  . Smokeless tobacco: Never Used  Substance Use Topics  . Alcohol use: Yes    Comment: occ  . Drug use: Yes    Types: Marijuana     Allergies   Cinnamon and Nickel   Review of  Systems Review of Systems  Constitutional: Positive for appetite change.  HENT: Negative for congestion.   Respiratory: Negative for shortness of breath.   Cardiovascular: Negative for chest pain.  Gastrointestinal: Positive for abdominal pain, diarrhea, nausea and vomiting.  Genitourinary: Negative for flank pain.  Musculoskeletal: Negative for back pain.  Skin: Negative for rash.  Neurological: Negative for weakness.  Psychiatric/Behavioral: Negative for confusion.     Physical Exam Updated Vital Signs BP 131/83 (BP Location: Left Arm)   Pulse 86   Temp 97.8 F (36.6 C) (Oral)   Resp 20   Ht 5\' 3"  (1.6 m)   Wt 42.6 kg (94 lb)   SpO2 100%   BMI 16.65 kg/m   Physical Exam  Constitutional: She appears well-developed.  HENT:  Head: Normocephalic.  Eyes: Pupils are equal, round, and reactive to light.  Cardiovascular: Regular rhythm.  Pulmonary/Chest: Effort normal.  Abdominal: Normal appearance.  Mild epigastric tenderness without rebound or guarding.  Skin: Skin is warm.     ED Treatments / Results  Labs (all labs ordered are listed, but only abnormal results are displayed) Labs Reviewed  COMPREHENSIVE METABOLIC PANEL - Abnormal; Notable for the following components:      Result Value   Potassium 2.7 (*)    CO2 20 (*)    Glucose, Bld 131 (*)    Total Protein 8.3 (*)    Albumin 5.5 (*)    Anion gap 18 (*)    All other components within normal limits  CBC WITH DIFFERENTIAL/PLATELET - Abnormal; Notable for the following components:   Hemoglobin 16.0 (*)    MCHC 36.9 (*)    Platelets 117 (*)    Neutro Abs 8.5 (*)    All other components within normal limits  URINALYSIS, ROUTINE W REFLEX MICROSCOPIC - Abnormal; Notable for the following components:   Hgb urine dipstick LARGE (*)    Bilirubin Urine SMALL (*)    Ketones, ur >80 (*)    Protein, ur 30 (*)    All other components within normal limits  URINALYSIS, MICROSCOPIC (REFLEX) - Abnormal; Notable for the  following components:   Bacteria, UA MANY (*)    All other components within normal limits  LIPASE, BLOOD  PREGNANCY, URINE  MAGNESIUM    EKG None  Radiology No results found.  Procedures Procedures (including critical care time)  Medications Ordered in ED Medications  sodium chloride 0.9 % bolus 1,000 mL (0 mLs Intravenous Stopped 01/01/18 0929)  famotidine (PEPCID) IVPB 20 mg premix (0 mg Intravenous Stopped 01/01/18 0847)  metoCLOPramide (REGLAN) injection 10 mg (10 mg Intravenous Given 01/01/18 0820)  dicyclomine (BENTYL) injection 10 mg (10 mg Intramuscular Given 01/01/18 0849)  potassium chloride 10 mEq  in 100 mL IVPB (0 mEq Intravenous Stopped 01/01/18 1028)  sodium chloride 0.9 % bolus 1,000 mL (0 mLs Intravenous Stopped 01/01/18 1028)  ondansetron (ZOFRAN) injection 4 mg (4 mg Intravenous Given 01/01/18 1003)     Initial Impression / Assessment and Plan / ED Course  I have reviewed the triage vital signs and the nursing notes.  Pertinent labs & imaging results that were available during my care of the patient were reviewed by me and considered in my medical decision making (see chart for details).     Patient with episode of nausea vomiting diarrhea.  History of recurrent nausea and vomiting.  Question of cyclic vomiting.  Has some dehydration with hypokalemia.  Patient is feeling somewhat better but states that she does not want to be here because she is alone.  Discharged home with some symptomatic treatment.  Final Clinical Impressions(s) / ED Diagnoses   Final diagnoses:  Nausea vomiting and diarrhea  Hypokalemia    ED Discharge Orders        Ordered    metoCLOPramide (REGLAN) 10 MG tablet  Every 8 hours PRN     01/01/18 1028    potassium chloride SA (K-DUR,KLOR-CON) 20 MEQ tablet  2 times daily     01/01/18 1028       Benjiman CorePickering, Leeba Barbe, MD 01/01/18 1559

## 2018-01-01 NOTE — ED Notes (Signed)
Patient ambulatory to bathroom but states unable to provide urine specimen.

## 2018-01-01 NOTE — ED Triage Notes (Signed)
Patient reports generalized abdominal pain since yesterday.  Also c/o multiple episodes of vomiting and diarrhea.

## 2018-01-03 ENCOUNTER — Encounter (HOSPITAL_BASED_OUTPATIENT_CLINIC_OR_DEPARTMENT_OTHER): Payer: Self-pay | Admitting: Emergency Medicine

## 2018-01-03 ENCOUNTER — Emergency Department (HOSPITAL_BASED_OUTPATIENT_CLINIC_OR_DEPARTMENT_OTHER)
Admission: EM | Admit: 2018-01-03 | Discharge: 2018-01-03 | Disposition: A | Discharge status: Home / Self Care | Payer: Medicaid Other | Attending: Emergency Medicine | Admitting: Emergency Medicine

## 2018-01-03 ENCOUNTER — Other Ambulatory Visit: Payer: Self-pay

## 2018-01-03 DIAGNOSIS — F1210 Cannabis abuse, uncomplicated: Secondary | ICD-10-CM | POA: Diagnosis not present

## 2018-01-03 DIAGNOSIS — F17210 Nicotine dependence, cigarettes, uncomplicated: Secondary | ICD-10-CM | POA: Diagnosis not present

## 2018-01-03 DIAGNOSIS — Z79899 Other long term (current) drug therapy: Secondary | ICD-10-CM | POA: Diagnosis not present

## 2018-01-03 DIAGNOSIS — R1110 Vomiting, unspecified: Secondary | ICD-10-CM | POA: Diagnosis not present

## 2018-01-03 LAB — URINALYSIS, ROUTINE W REFLEX MICROSCOPIC
GLUCOSE, UA: NEGATIVE mg/dL
Hgb urine dipstick: NEGATIVE
LEUKOCYTES UA: NEGATIVE
Nitrite: NEGATIVE
PH: 6.5 (ref 5.0–8.0)
Protein, ur: NEGATIVE mg/dL
SPECIFIC GRAVITY, URINE: 1.02 (ref 1.005–1.030)

## 2018-01-03 LAB — CBC
HCT: 39.9 % (ref 36.0–46.0)
HEMOGLOBIN: 14.5 g/dL (ref 12.0–15.0)
MCH: 32.4 pg (ref 26.0–34.0)
MCHC: 36.3 g/dL — ABNORMAL HIGH (ref 30.0–36.0)
MCV: 89.1 fL (ref 78.0–100.0)
PLATELETS: 208 10*3/uL (ref 150–400)
RBC: 4.48 MIL/uL (ref 3.87–5.11)
RDW: 12 % (ref 11.5–15.5)
WBC: 10.2 10*3/uL (ref 4.0–10.5)

## 2018-01-03 LAB — BASIC METABOLIC PANEL
ANION GAP: 11 (ref 5–15)
BUN: 9 mg/dL (ref 6–20)
CHLORIDE: 103 mmol/L (ref 98–111)
CO2: 23 mmol/L (ref 22–32)
Calcium: 9.2 mg/dL (ref 8.9–10.3)
Creatinine, Ser: 0.74 mg/dL (ref 0.44–1.00)
GFR calc Af Amer: >60 mL/min (ref >60)
Glucose, Bld: 95 mg/dL (ref 70–99)
POTASSIUM: 3.3 mmol/L — AB (ref 3.5–5.1)
SODIUM: 137 mmol/L (ref 135–145)

## 2018-01-03 LAB — RAPID URINE DRUG SCREEN, HOSP PERFORMED
AMPHETAMINES: NOT DETECTED
BENZODIAZEPINES: NOT DETECTED
COCAINE: NOT DETECTED
OPIATES: NOT DETECTED
TETRAHYDROCANNABINOL: DETECTED — AB

## 2018-01-03 LAB — PREGNANCY, URINE: Preg Test, Ur: NEGATIVE

## 2018-01-03 MED ORDER — METOCLOPRAMIDE HCL 5 MG/ML IJ SOLN
10.0000 mg | Freq: Once | INTRAMUSCULAR | Status: AC
  Administered 2018-01-03: 10 mg via INTRAVENOUS
  Filled 2018-01-03: qty 2

## 2018-01-03 MED ORDER — SODIUM CHLORIDE 0.9 % IV SOLN
INTRAVENOUS | Status: DC
  Administered 2018-01-03: 13:00:00 via INTRAVENOUS

## 2018-01-03 MED ORDER — SODIUM CHLORIDE 0.9 % IV BOLUS
2000.0000 mL | Freq: Once | INTRAVENOUS | Status: AC
  Administered 2018-01-03: 2000 mL via INTRAVENOUS

## 2018-01-03 MED ORDER — FAMOTIDINE IN NACL 20-0.9 MG/50ML-% IV SOLN
20.0000 mg | Freq: Once | INTRAVENOUS | Status: AC
  Administered 2018-01-03: 20 mg via INTRAVENOUS
  Filled 2018-01-03: qty 50

## 2018-01-03 MED ORDER — ONDANSETRON 4 MG PO TBDP
4.0000 mg | ORAL_TABLET | Freq: Once | ORAL | Status: AC
  Administered 2018-01-03: 4 mg via ORAL
  Filled 2018-01-03: qty 1

## 2018-01-03 NOTE — ED Notes (Signed)
ED Provider at bedside. 

## 2018-01-03 NOTE — ED Triage Notes (Signed)
Pt states she has been vomiting since Saturday  Pt states she has had diarrhea as well  Pt is c/o generalized abd pain with cramping  Pt states she was given medication for nausea without relief

## 2018-01-03 NOTE — ED Provider Notes (Signed)
[facility] EMERGENCY DEPARTMENT Provider Note   CSN: [id number] Arrival date & time: 01/03/18  1324     History   Chief Complaint Chief Complaint  Patient presents with  . Emesis    HPI Tiffany Williamson is a 22 y.o. female.  Patient seen frequently in the month of May and June and now July for her hyperemesis.  Probably related to [facility] use.  Patient just seen on the eighth.  Patient had a low potassium at that time.  Patient started on potassium supplementation.  Patient with persistent vomiting.  Patient felt better when she left here on the eighth but now its reoccurred.  Denies any vomiting of any blood.  Patient states that she has had an unexpected weight loss basically starting in May probably related to the frequent visits for this.  Patient used to be followed by GI medicine for this but lost insurance the initial work-up according patient from GI was that there was not any significant abnormal findings.  No blood in the vomit.     Past Medical History:  Diagnosis Date  . Chronic UTI   . Depression   . Drug abuse, marijuana   . Hyperemesis   . Migraine     Patient Active Problem List   Diagnosis Date Noted  . Encounter to establish care 09/14/2017  . Tobacco use disorder 09/14/2017  . Marijuana use 09/14/2017  . Cyclical vomiting associated with migraine 10/28/2016  . Depression 10/28/2016  . Persistent proteinuria 10/28/2016  . Flank pain 10/28/2016  . Hypokalemia 10/28/2016    History reviewed. No pertinent surgical history.   OB History   None      Home Medications    Prior to Admission medications   Medication Sig Start Date End Date Taking? Authorizing Provider  cetirizine-pseudoephedrine (ZYRTEC-D) 5-120 MG tablet Take 1 tablet by mouth daily. 09/12/17  Yes Yu, Amy V, PA-C  FLUoxetine (PROZAC) 20 MG capsule Take 20 mg by mouth daily.   Yes [provider]  ondansetron (ZOFRAN ODT) 4 MG disintegrating tablet Take 1 tablet (4 mg  total) by mouth every 8 (eight) hours as needed for nausea or vomiting. 08/13/17  Yes Little, Ambrose Finland, MD  ondansetron (ZOFRAN) 4 MG tablet Take 1 tablet (4 mg total) by mouth every 8 (eight) hours as needed for nausea or vomiting. 10/15/16  Yes Tegeler, Canary Brim, MD  pantoprazole (PROTONIX) 40 MG tablet Take 1 tablet (40 mg total) by mouth daily. 10/30/16  Yes Arnetha Courser, MD  potassium chloride SA (K-DUR,KLOR-CON) 20 MEQ tablet Take 1 tablet (20 mEq total) by mouth 2 (two) times daily. 01/01/18  Yes Benjiman Core, MD  promethazine (PHENERGAN) 25 MG suppository Place 1 suppository (25 mg total) rectally every 6 (six) hours as needed for nausea or vomiting. 12/10/17  Yes Pricilla Loveless, MD  promethazine (PHENERGAN) 25 MG tablet Take 1 tablet (25 mg total) by mouth every 8 (eight) hours as needed for nausea or vomiting. 11/18/17  Yes Tilden Fossa, MD  chlorproMAZINE (THORAZINE) 25 MG tablet Take 1 tablet (25 mg total) by mouth 3 (three) times daily as needed for nausea or vomiting. 06/27/17   Gwyneth Sprout, MD  fluticasone (FLONASE) 50 MCG/ACT nasal spray Place 2 sprays into both nostrils daily. 09/12/17   Cathie Hoops, Amy V, PA-C  ipratropium (ATROVENT) 0.06 % nasal spray Place 2 sprays into both nostrils 4 (four) times daily. 09/12/17   Cathie Hoops, Amy V, PA-C  meloxicam (MOBIC) 7.5 MG tablet Take 1  tablet (7.5 mg total) by mouth daily. 09/12/17   Cathie Hoops, Amy V, PA-C  metoCLOPramide (REGLAN) 10 MG tablet Take 1 tablet (10 mg total) by mouth every 8 (eight) hours as needed for nausea. 01/01/18   Benjiman Core, MD  nicotine (NICODERM CQ - DOSED IN MG/24 HOURS) 14 mg/24hr patch Place 1 patch (14 mg total) onto the skin daily. 09/14/17   Marthenia Rolling, DO  saccharomyces boulardii (FLORASTOR) 250 MG capsule Take 1 capsule (250 mg total) by mouth 2 (two) times daily. 04/26/17   Meredeth Ide, MD    Family History Family History  Problem Relation Age of Onset  . Healthy Mother   . Breast cancer Maternal  Grandmother     Social History Social History   Tobacco Use  . Smoking status: Current Every Day Smoker    Packs/day: 0.50    Types: Cigarettes  . Smokeless tobacco: Never Used  Substance Use Topics  . Alcohol use: Not Currently    Comment: occ  . Drug use: Yes    Types: Marijuana    Comment: last used 2 weeks ago     Allergies   Cinnamon and Nickel   Review of Systems Review of Systems  Constitutional: Positive for appetite change and unexpected weight change.  HENT: Negative for congestion.   Eyes: Negative for redness.  Respiratory: Negative for shortness of breath.   Cardiovascular: Negative for chest pain.  Gastrointestinal: Positive for abdominal pain, nausea and vomiting.  Genitourinary: Negative for dysuria.  Musculoskeletal: Negative for back pain.  Skin: Negative for rash.  Neurological: Negative for headaches.  Hematological: Does not bruise/bleed easily.  Psychiatric/Behavioral: Negative for confusion.     Physical Exam Updated Vital Signs BP 117/78 (BP Location: Right Arm)   Pulse 72   Temp 97.7 F (36.5 C) (Oral)   Resp 17   SpO2 100%   Physical Exam  Constitutional: She is oriented to person, place, and time. She appears well-developed and well-nourished. No distress.  HENT:  Head: Normocephalic and atraumatic.  Mucous membranes dry.  Eyes: Pupils are equal, round, and reactive to light. Conjunctivae and EOM are normal.  Neck: Normal range of motion. Neck supple.  Cardiovascular: Normal rate, regular rhythm and normal heart sounds.  Pulmonary/Chest: Effort normal and breath sounds normal.  Abdominal: Soft. Bowel sounds are normal.  Some generalized tenderness no guarding  Musculoskeletal: Normal range of motion.  Neurological: She is alert and oriented to person, place, and time. No cranial nerve deficit or sensory deficit. She exhibits normal muscle tone. Coordination normal.  Skin: Skin is warm. No rash noted.  Nursing note and vitals  reviewed.    ED Treatments / Results  Labs (all labs ordered are listed, but only abnormal results are displayed) Labs Reviewed  URINALYSIS, ROUTINE W REFLEX MICROSCOPIC - Abnormal; Notable for the following components:      Result Value   APPearance HAZY (*)    Bilirubin Urine SMALL (*)    Ketones, ur >80 (*)    All other components within normal limits  RAPID URINE DRUG SCREEN, HOSP PERFORMED - Abnormal; Notable for the following components:   Tetrahydrocannabinol DETECTED (*)    Barbiturates   (*)    Value: Result not available. Reagent lot number recalled by manufacturer.   All other components within normal limits  CBC - Abnormal; Notable for the following components:   MCHC 36.3 (*)    All other components within normal limits  BASIC METABOLIC PANEL - Abnormal; Notable  for the following components:   Potassium 3.3 (*)    All other components within normal limits  PREGNANCY, URINE    EKG None  Radiology No results found.  Procedures Procedures (including critical care time)  Medications Ordered in ED Medications  0.9 %  sodium chloride infusion ( Intravenous New Bag/Given 01/03/18 1252)  sodium chloride 0.9 % bolus 2,000 mL (0 mLs Intravenous Stopped 01/03/18 1250)  ondansetron (ZOFRAN-ODT) disintegrating tablet 4 mg (4 mg Oral Given 01/03/18 1122)  metoCLOPramide (REGLAN) injection 10 mg (10 mg Intravenous Given 01/03/18 1123)  famotidine (PEPCID) IVPB 20 mg premix (0 mg Intravenous Stopped 01/03/18 1158)     Initial Impression / Assessment and Plan / ED Course  I have reviewed the triage vital signs and the nursing notes.  Pertinent labs & imaging results that were available during my care of the patient were reviewed by me and considered in my medical decision making (see chart for details).     Patient with hyperemesis cyclical in nature probably related [facility].  Patient is aware of this she is trying to cut back.  Patient improved here with 2 L of  fluid treated with what normally works for her which is Reglan and Pepcid and Zofran.  Patient does not like the way Haldol makes her feel so she refused that.  Patient is lost her insurance was followed by GI medicine in the past their initial work-up was negative.  She will follow back up with GI medicine when her insurance is reinstituted which she expects to happen soon.  Patient's potassium was improved today at 3.3.  Just 2 days ago it was 2.8.  She will continue her potassium supplementation.  Patient feels much better with the treatment provided here today.  Final Clinical Impressions(s) / ED Diagnoses   Final diagnoses:  Hyperemesis  Marijuana abuse    ED Discharge Orders    None       Vanetta MuldersZackowski, Halli Equihua, MD 01/03/18 1359

## 2018-01-03 NOTE — Discharge Instructions (Signed)
Continue potassium supplements.  Potassium here today 3.3.  Recommend continuing to stay away from the marijuana.  Follow-up with GI medicine when she have your insurance coverage again.  Return for any new or worse symptoms.

## 2018-01-04 ENCOUNTER — Encounter: Payer: Self-pay | Admitting: Family Medicine

## 2018-01-05 ENCOUNTER — Telehealth: Payer: Self-pay

## 2018-01-05 ENCOUNTER — Other Ambulatory Visit (HOSPITAL_COMMUNITY)
Admission: RE | Admit: 2018-01-05 | Discharge: 2018-01-05 | Disposition: A | Discharge status: Home / Self Care | Payer: Medicaid Other | Source: Ambulatory Visit | Attending: Family Medicine | Admitting: Family Medicine

## 2018-01-05 ENCOUNTER — Other Ambulatory Visit: Payer: Self-pay

## 2018-01-05 ENCOUNTER — Encounter: Payer: Self-pay | Admitting: Family Medicine

## 2018-01-05 ENCOUNTER — Ambulatory Visit (INDEPENDENT_AMBULATORY_CARE_PROVIDER_SITE_OTHER): Payer: Self-pay | Admitting: Family Medicine

## 2018-01-05 VITALS — BP 100/64 | HR 125 | Temp 98.3°F | Wt 92.2 lb

## 2018-01-05 DIAGNOSIS — B9689 Other specified bacterial agents as the cause of diseases classified elsewhere: Secondary | ICD-10-CM

## 2018-01-05 DIAGNOSIS — R112 Nausea with vomiting, unspecified: Secondary | ICD-10-CM | POA: Insufficient documentation

## 2018-01-05 DIAGNOSIS — N760 Acute vaginitis: Secondary | ICD-10-CM

## 2018-01-05 LAB — POCT WET PREP (WET MOUNT)
Clue Cells Wet Prep Whiff POC: NEGATIVE
Trichomonas Wet Prep HPF POC: ABSENT

## 2018-01-05 MED ORDER — ONDANSETRON HCL 4 MG/5ML PO SOLN: 8.0000 mg | Freq: Three times a day (TID) | ORAL | 0 refills | Status: DC | PRN

## 2018-01-05 MED ORDER — METRONIDAZOLE 0.75 % VA GEL: 1.0000 | Freq: Every day | VAGINAL | 0 refills | Status: AC

## 2018-01-05 MED ORDER — METRONIDAZOLE 1.3 % VA GEL: 1.0000 | Freq: Once | VAGINAL | 0 refills | Status: DC

## 2018-01-05 NOTE — Telephone Encounter (Signed)
Tiffany Williamson, Aaron B, MD  P [facility]        Pls call pt and tell her that her wet prep was positive for bacterial consistent with BV. I will write an Rx for metronidazole. Pls advise pt not to drink alcohol. This finding does not explain her abdominal pain/vomiting. Her STI results are still pending.    Pt contacted and informed of rx to pharmacy and directions on use given.

## 2018-01-05 NOTE — Assessment & Plan Note (Signed)
Wet mount positive for BV.  Patient given metronidazole gel given that she is not tolerating p.o.

## 2018-01-05 NOTE — Assessment & Plan Note (Signed)
Abdominal pain with vomiting has broad range of since her diagnosis.Vital signs stable, other than mild tachycardia patient is afebrile in office.   Labs in the past 2 visits to ED are reassuring other than hypokalemia.  Appears euvolemic.  Given that she is young and of childbearing age, with vaginal discharge, concern for STI with possible PID.  Wet mount with GC chlamydia, RPR, HIV.  Urine dip to evaluate for UTI negative today, negative x2 ED past week.  Unlikely to be UTI.  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  Patient has had minimal relief with antiemetics Zofran, Reglan, Reglan.  Will trial liquid Zofran see if patient can tolerate.  BMP to recheck potassium.  Encourage patient to take in lots of fluids.  Patient given strict return precautions

## 2018-01-05 NOTE — Telephone Encounter (Signed)
Patient Rx for Metronidazole gel is $242. Patient has no insurance. Is there a less expensive alternative?  Call back is [phone]  Ples SpecterAlisa Gioia Ranes, RN [facility])

## 2018-01-05 NOTE — Patient Instructions (Signed)
Vomiting, Adult  Vomiting occurs when stomach contents are thrown up and out of the mouth. Many people notice nausea before vomiting. Vomiting can make you feel weak and dehydrated. Dehydration can make you tired and thirsty, cause you to have a dry mouth, and decrease how often you urinate. Older adults and people who have other diseases or a weak immune system are at higher risk for dehydration.It is important to treat vomiting as told by your health care provider.  Follow these instructions at home:  Follow your health care provider's instructions about how to care for yourself at home.  Eating and drinking  Follow these recommendations as told by your health care provider:   Take an oral rehydration solution (ORS). This is a drink that is sold at pharmacies and retail stores.   Eat bland, easy-to-digest foods in small amounts as you are able. These foods include bananas, applesauce, rice, lean meats, toast, and crackers.   Drink clear fluids in small amounts as you are able. Clear fluids include water, ice chips, low-calorie sports drinks, and fruit juice that has water added (diluted fruit juice).   Avoid fluids that contain a lot of sugar or caffeine.   Avoid alcohol and foods that are spicy or fatty.    General instructions     Wash your hands frequently with soap and water. If soap and water are not available, use hand sanitizer. Make sure that everyone in your household washes their hands frequently.   Take over-the-counter and prescription medicines only as told by your health care provider.   Watch your condition for any changes.   Keep all follow-up visits as told by your health care provider. This is important.  Contact a health care provider if:   You have a fever.   You are not able to keep fluids down.   Your vomiting gets worse.   You have new symptoms.   You feel light-headed or dizzy.   You have a headache.   You have muscle cramps.  Get help right away if:   You have pain in  your chest, neck, arm, or jaw.   You feel extremely weak or you faint.   You have persistent vomiting.   You have vomit that is bright red or looks like black coffee grounds.   You have stools that are bloody or black, or stools that look like tar.   You have severe pain, cramping, or bloating in your abdomen.   You have a severe headache, a stiff neck, or both.   You have a rash.   You have trouble breathing or you are breathing very quickly.   Your heart is beating very quickly.   Your skin feels cold and clammy.   You feel confused.   You have pain while urinating.   You have signs of dehydration, such as:  ? Dark urine, or very little or no urine.  ? Cracked lips.  ? Dry mouth.  ? Sunken eyes.  ? Sleepiness.  ? Weakness.  These symptoms may represent a serious problem that is an emergency. Do not wait to see if the symptoms will go away. Get medical help right away. Call your local emergency services (911 in the [place] Do not drive yourself to the hospital.  This information is not intended to replace advice given to you by your health care provider. Make sure you discuss any questions you have with your health care provider.  Document Released: 07/10/2015 Document Revised: 11/19/2015 Document   Reviewed: 02/17/2015  Elsevier Interactive Patient Education  2018 [employer]

## 2018-01-05 NOTE — Telephone Encounter (Signed)
MD communicating with patient via MyChart on this.  Ples SpecterAlisa Kinnedy Mongiello, RN [facility])

## 2018-01-05 NOTE — Progress Notes (Signed)
Subjective:     Tiffany Williamson is a 22 y.o. female who presents for evaluation of abdominal pain with associated nausea and vomiting.  Past medical history is significant for cyclical vomiting with migraines, marijuana use.  Onset was 6 days ago. Symptoms have been unchanged. The pain is described as cramping and dull, and is 4/10 in intensity. Pain is located in the Generalized but also in the lower abdomen without radiation.  Aggravating factors: Patient has vomiting and diarrhea, vomiting especially after eating or drinking.  Alleviating factors: none. Associated symptoms: diarrhea, chills, nausea and vomiting.  Patient is on Nexplanon, also endorses some vaginal discharge and bleeding, unsure if she is also had blood in her stool.  Patient has multiple with her partner.  Intermittent production.  Patient is with mother in room.  Patient was seen in the ED twice over the past week lab significant for potassium 2.7, improved to 3.3 on repeat at ED two days later. Pt has tried zofran, phenergan, reglan w/o improvement. UDS + for THC. Pt denies smoking marijuana in past 3 weeks. Urine preg negx2. Denies dysuria.   The patient's history has been marked as reviewed and updated as appropriate.  Review of Systems Pertinent items are noted in HPI.     Objective:    BP 100/64   Pulse (!) 125   Temp 98.3 F (36.8 C) (Oral)   Wt 92 lb 3.2 oz (41.8 kg)   SpO2 99%   BMI 16.33 kg/m   General Appearance:    Alert, cooperative, no distress, appears stated age  Head:    Normocephalic, without obvious abnormality, atraumatic  Eyes:    PERRL, conjunctiva/corneas clear, EOM's intact, fundi    benign, both eyes  Ears:    Normal TM's and external ear canals, both ears  Nose:   Nares normal, septum midline, mucosa normal, no drainage    or sinus tenderness  Throat:   Lips, mucosa, and tongue normal; teeth and gums normal  Neck:   Supple, symmetrical, trachea midline, ; no carotid   bruit or JVD  Back:      Symmetric, no curvature, ROM normal, no CVA tenderness  Lungs:     Clear to auscultation bilaterally, respirations unlabored  Chest Wall:    No tenderness or deformity   Heart:    Regular rate and rhythm, S1 and S2 normal, no murmur, rub   or gallop  Abdomen:     Soft, non-tender, bowel sounds active all four quadrants,    no masses, no organomegaly  Genitalia:    Normal female without lesion, white discharge, mild cervical tenderness  Skin:   Skin color, texture, turgor normal, no rashes or lesions  Neurologic:   CNII-XII intact, normal strength, sensation and       Assessment and Plan:  Nausea and vomiting Abdominal pain with vomiting has broad range of since her diagnosis.Vital signs stable, other than mild tachycardia patient is afebrile in office.   Labs in the past 2 visits to ED are reassuring other than hypokalemia.  Appears euvolemic.  Given that she is young and of childbearing age, with vaginal discharge, concern for STI with possible PID.  Wet mount with GC chlamydia, RPR, HIV.  Urine dip to evaluate for UTI negative today, negative x2 ED past week.  Unlikely to be UTI.  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  Patient has had minimal relief with antiemetics Zofran,  Reglan, Reglan.  Will trial liquid Zofran see if patient can tolerate.  BMP to recheck potassium.  Encourage patient to take in lots of fluids.  Patient given strict return precautions  Bacterial vaginosis Wet mount positive for BV.  Patient given metronidazole gel given that she is not tolerating p.o.

## 2018-01-06 LAB — BASIC METABOLIC PANEL
BUN/Creatinine Ratio: 9 (ref 9–23)
BUN: 6 mg/dL (ref 6–20)
CALCIUM: 10 mg/dL (ref 8.7–10.2)
CO2: 14 mmol/L — AB (ref 20–29)
Chloride: 97 mmol/L (ref 96–106)
Creatinine, Ser: 0.68 mg/dL (ref 0.57–1.00)
GFR calc Af Amer: 146 mL/min/{1.73_m2} (ref >59)
GFR, EST NON AFRICAN AMERICAN: 126 mL/min/{1.73_m2} (ref >59)
GLUCOSE: 73 mg/dL (ref 65–99)
POTASSIUM: 3.8 mmol/L (ref 3.5–5.2)
SODIUM: 136 mmol/L (ref 134–144)

## 2018-01-06 LAB — HIV ANTIBODY (ROUTINE TESTING W REFLEX): HIV Screen 4th Generation wRfx: NONREACTIVE

## 2018-01-06 LAB — RPR: RPR: NONREACTIVE

## 2018-01-08 LAB — CERVICOVAGINAL ANCILLARY ONLY
Chlamydia: NEGATIVE
NEISSERIA GONORRHEA: NEGATIVE

## 2018-01-10 ENCOUNTER — Encounter: Payer: Self-pay | Admitting: Family Medicine

## 2018-03-03 ENCOUNTER — Encounter (HOSPITAL_BASED_OUTPATIENT_CLINIC_OR_DEPARTMENT_OTHER): Payer: Self-pay | Admitting: *Deleted

## 2018-03-03 ENCOUNTER — Other Ambulatory Visit: Payer: Self-pay

## 2018-03-03 ENCOUNTER — Emergency Department (HOSPITAL_BASED_OUTPATIENT_CLINIC_OR_DEPARTMENT_OTHER)
Admission: EM | Admit: 2018-03-03 | Discharge: 2018-03-04 | Disposition: A | Discharge status: Home / Self Care | Payer: Medicaid Other | Attending: Emergency Medicine | Admitting: Emergency Medicine

## 2018-03-03 DIAGNOSIS — E876 Hypokalemia: Secondary | ICD-10-CM | POA: Diagnosis not present

## 2018-03-03 DIAGNOSIS — F17210 Nicotine dependence, cigarettes, uncomplicated: Secondary | ICD-10-CM | POA: Insufficient documentation

## 2018-03-03 DIAGNOSIS — Z79899 Other long term (current) drug therapy: Secondary | ICD-10-CM | POA: Diagnosis not present

## 2018-03-03 DIAGNOSIS — R1013 Epigastric pain: Secondary | ICD-10-CM | POA: Insufficient documentation

## 2018-03-03 DIAGNOSIS — F1210 Cannabis abuse, uncomplicated: Secondary | ICD-10-CM | POA: Diagnosis not present

## 2018-03-03 DIAGNOSIS — G43A0 Cyclical vomiting, not intractable: Secondary | ICD-10-CM | POA: Insufficient documentation

## 2018-03-03 DIAGNOSIS — R1110 Vomiting, unspecified: Secondary | ICD-10-CM | POA: Diagnosis present

## 2018-03-03 DIAGNOSIS — R1115 Cyclical vomiting syndrome unrelated to migraine: Secondary | ICD-10-CM

## 2018-03-03 LAB — COMPREHENSIVE METABOLIC PANEL
ALBUMIN: 5.5 g/dL — AB (ref 3.5–5.0)
ALK PHOS: 62 U/L (ref 38–126)
ALT: 20 U/L (ref 0–44)
ANION GAP: 19 — AB (ref 5–15)
AST: 43 U/L — AB (ref 15–41)
BILIRUBIN TOTAL: 1.5 mg/dL — AB (ref 0.3–1.2)
BUN: 22 mg/dL — AB (ref 6–20)
CALCIUM: 10 mg/dL (ref 8.9–10.3)
CO2: 20 mmol/L — ABNORMAL LOW (ref 22–32)
Chloride: 101 mmol/L (ref 98–111)
Creatinine, Ser: 0.87 mg/dL (ref 0.44–1.00)
GFR calc Af Amer: >60 mL/min (ref >60)
GLUCOSE: 133 mg/dL — AB (ref 70–99)
POTASSIUM: 3.1 mmol/L — AB (ref 3.5–5.1)
Sodium: 140 mmol/L (ref 135–145)
Total Protein: 8.5 g/dL — ABNORMAL HIGH (ref 6.5–8.1)

## 2018-03-03 LAB — CBC
HCT: 44.2 % (ref 36.0–46.0)
Hemoglobin: 15.9 g/dL — ABNORMAL HIGH (ref 12.0–15.0)
MCH: 33.2 pg (ref 26.0–34.0)
MCHC: 36 g/dL (ref 30.0–36.0)
MCV: 92.3 fL (ref 78.0–100.0)
Platelets: 323 10*3/uL (ref 150–400)
RBC: 4.79 MIL/uL (ref 3.87–5.11)
RDW: 14 % (ref 11.5–15.5)
WBC: 19.8 10*3/uL — AB (ref 4.0–10.5)

## 2018-03-03 LAB — LIPASE, BLOOD: Lipase: 20 U/L (ref 11–51)

## 2018-03-03 MED ORDER — ONDANSETRON HCL 4 MG/2ML IJ SOLN
INTRAMUSCULAR | Status: AC
  Filled 2018-03-03: qty 2

## 2018-03-03 MED ORDER — ONDANSETRON HCL 4 MG/2ML IJ SOLN
4.0000 mg | Freq: Once | INTRAMUSCULAR | Status: AC
  Administered 2018-03-03: 4 mg via INTRAVENOUS

## 2018-03-03 NOTE — ED Triage Notes (Signed)
Pt reports n/v/d since 0600 Friday morning. States she has vomited "at least 30 times". Diarrhea  "at least 20 times". States she is vomiting large amount. Denies etoh and drug use.

## 2018-03-03 NOTE — ED Notes (Signed)
Pt denies smoking marijuana but reports being around it yesterday/today.

## 2018-03-04 LAB — URINALYSIS, ROUTINE W REFLEX MICROSCOPIC
GLUCOSE, UA: NEGATIVE mg/dL
KETONES UR: 40 mg/dL — AB
Nitrite: POSITIVE — AB
PROTEIN: 100 mg/dL — AB
Specific Gravity, Urine: 1.01 (ref 1.005–1.030)
pH: 7 (ref 5.0–8.0)

## 2018-03-04 LAB — URINALYSIS, MICROSCOPIC (REFLEX)

## 2018-03-04 LAB — PREGNANCY, URINE: PREG TEST UR: NEGATIVE

## 2018-03-04 MED ORDER — MORPHINE SULFATE (PF) 4 MG/ML IV SOLN
4.0000 mg | Freq: Once | INTRAVENOUS | Status: AC
  Administered 2018-03-04: 4 mg via INTRAVENOUS
  Filled 2018-03-04: qty 1

## 2018-03-04 MED ORDER — POTASSIUM CHLORIDE 10 MEQ/100ML IV SOLN
10.0000 meq | Freq: Once | INTRAVENOUS | Status: AC
  Administered 2018-03-04: 10 meq via INTRAVENOUS
  Filled 2018-03-04: qty 100

## 2018-03-04 MED ORDER — PROCHLORPERAZINE MALEATE 10 MG PO TABS: 10.0000 mg | ORAL_TABLET | Freq: Four times a day (QID) | ORAL | 0 refills | Status: DC | PRN

## 2018-03-04 MED ORDER — PROCHLORPERAZINE EDISYLATE 10 MG/2ML IJ SOLN
10.0000 mg | Freq: Once | INTRAMUSCULAR | Status: AC
  Administered 2018-03-04: 10 mg via INTRAVENOUS
  Filled 2018-03-04: qty 2

## 2018-03-04 MED ORDER — POTASSIUM CHLORIDE CRYS ER 20 MEQ PO TBCR: 20.0000 meq | EXTENDED_RELEASE_TABLET | Freq: Two times a day (BID) | ORAL | 0 refills | Status: DC

## 2018-03-04 MED ORDER — SODIUM CHLORIDE 0.9 % IV BOLUS
1000.0000 mL | Freq: Once | INTRAVENOUS | Status: AC
  Administered 2018-03-04: 1000 mL via INTRAVENOUS

## 2018-03-04 MED ORDER — DIPHENHYDRAMINE HCL 50 MG/ML IJ SOLN
25.0000 mg | Freq: Once | INTRAMUSCULAR | Status: AC
  Administered 2018-03-04: 25 mg via INTRAVENOUS
  Filled 2018-03-04: qty 1

## 2018-03-04 NOTE — ED Provider Notes (Addendum)
[facility] EMERGENCY DEPARTMENT Provider Note   CSN: [id number] Arrival date & time: 03/03/18  2147     History   Chief Complaint Chief Complaint  Patient presents with  . Emesis    HPI Tiffany Williamson is a 22 y.o. female.  The history is provided by the patient.  She has history of cyclic vomiting and hypokalemia and comes in with vomiting over the last 2 days.  She is vomited multiple times and has been unable to hold anything down.  She has had chills and sweats but has not taken her fever.  She denies constipation or diarrhea.  In the past, there has been some question about whether her vomiting is related to marijuana use, but she states that she has not been smoking marijuana.  She is complaining of epigastric pain which she rates at 9/10.  Pain is sharp and radiates to the back.  It is not affected by anything, including emesis.  Past Medical History:  Diagnosis Date  . Chronic UTI   . Depression   . Drug abuse, marijuana   . Hyperemesis   . Migraine     Patient Active Problem List   Diagnosis Date Noted  . Bacterial vaginosis 01/05/2018  . Encounter to establish care 09/14/2017  . Tobacco use disorder 09/14/2017  . Marijuana use 09/14/2017  . Nausea and vomiting 04/25/2017  . Cyclical vomiting associated with migraine 10/28/2016  . Depression 10/28/2016  . Persistent proteinuria 10/28/2016  . Flank pain 10/28/2016  . Hypokalemia 10/28/2016    History reviewed. No pertinent surgical history.   OB History   None      Home Medications    Prior to Admission medications   Medication Sig Start Date End Date Taking? Authorizing Provider  promethazine (PHENERGAN) 25 MG suppository Place 1 suppository (25 mg total) rectally every 6 (six) hours as needed for nausea or vomiting. 12/10/17  Yes Pricilla Loveless, MD  cetirizine-pseudoephedrine (ZYRTEC-D) 5-120 MG tablet Take 1 tablet by mouth daily. 09/12/17   Cathie Hoops, Amy V, PA-C  chlorproMAZINE (THORAZINE) 25  MG tablet Take 1 tablet (25 mg total) by mouth 3 (three) times daily as needed for nausea or vomiting. 06/27/17   Gwyneth Sprout, MD  FLUoxetine (PROZAC) 20 MG capsule Take 20 mg by mouth daily.    [provider]  FLUoxetine (PROZAC) 20 MG capsule Take by mouth.    [provider]  fluticasone (FLONASE) 50 MCG/ACT nasal spray Place 2 sprays into both nostrils daily. 09/12/17   Cathie Hoops, Amy V, PA-C  ipratropium (ATROVENT) 0.06 % nasal spray Place 2 sprays into both nostrils 4 (four) times daily. 09/12/17   Cathie Hoops, Amy V, PA-C  meloxicam (MOBIC) 7.5 MG tablet Take 1 tablet (7.5 mg total) by mouth daily. 09/12/17   Cathie Hoops, Amy V, PA-C  metoCLOPramide (REGLAN) 10 MG tablet Take 1 tablet (10 mg total) by mouth every 8 (eight) hours as needed for nausea. 01/01/18   Benjiman Core, MD  nicotine (NICODERM CQ - DOSED IN MG/24 HOURS) 14 mg/24hr patch Place 1 patch (14 mg total) onto the skin daily. 09/14/17   Bland, Scott, DO  ondansetron (ZOFRAN ODT) 4 MG disintegrating tablet Take 1 tablet (4 mg total) by mouth every 8 (eight) hours as needed for nausea or vomiting. 08/13/17   Little, Ambrose Finland, MD  ondansetron [facility]) 4 MG/5ML solution Take 10 mLs (8 mg total) by mouth every 8 (eight) hours as needed for nausea or vomiting. 01/05/18   Janee Morn,  Ebbie Ridge, MD  pantoprazole (PROTONIX) 40 MG tablet Take 1 tablet (40 mg total) by mouth daily. 10/30/16   Arnetha Courser, MD  potassium chloride SA (K-DUR,KLOR-CON) 20 MEQ tablet Take 1 tablet (20 mEq total) by mouth 2 (two) times daily. 01/01/18   Benjiman Core, MD  promethazine (PHENERGAN) 25 MG tablet Take 1 tablet (25 mg total) by mouth every 8 (eight) hours as needed for nausea or vomiting. 11/18/17   Tilden Fossa, MD  saccharomyces boulardii (FLORASTOR) 250 MG capsule Take 1 capsule (250 mg total) by mouth 2 (two) times daily. 04/26/17   Meredeth Ide, MD    Family History Family History  Problem Relation Age of Onset  . Healthy Mother   . Breast  cancer Maternal Grandmother     Social History Social History   Tobacco Use  . Smoking status: Current Every Day Smoker    Packs/day: 0.50    Types: Cigarettes  . Smokeless tobacco: Never Used  Substance Use Topics  . Alcohol use: Not Currently    Comment: occ  . Drug use: Not Currently    Types: Marijuana    Comment: last used 2 weeks ago     Allergies   Cinnamon; Reglan [metoclopramide]; and Nickel   Review of Systems Review of Systems  All other systems reviewed and are negative.    Physical Exam Updated Vital Signs BP 140/90 (BP Location: Right Arm)   Pulse (!) 107   Temp 97.9 F (36.6 C) (Oral)   Resp 20   Ht 5\' 3"  (1.6 m)   Wt 43.3 kg   SpO2 100%   BMI 16.90 kg/m   Physical Exam  Nursing note and vitals reviewed.  22 year old female, resting comfortably and in no acute distress. Vital signs are significant for borderline elevated blood pressure, and mildly elevated heart rate. Oxygen saturation is 100%, which is normal. Head is normocephalic and atraumatic. PERRLA, EOMI. Oropharynx is clear. Neck is nontender and supple without adenopathy or JVD. Back is nontender and there is no CVA tenderness. Lungs are clear without rales, wheezes, or rhonchi. Chest is nontender. Heart has regular rate and rhythm without murmur. Abdomen is soft, flat, with moderate epigastric tenderness.  There is no rebound or guarding.  There are no masses or hepatosplenomegaly and peristalsis is hypoactive. Extremities have no cyanosis or edema, full range of motion is present. Skin is warm and dry without rash. Neurologic: Mental status is normal, cranial nerves are intact, there are no motor or sensory deficits.  ED Treatments / Results  Labs (all labs ordered are listed, but only abnormal results are displayed) Labs Reviewed  COMPREHENSIVE METABOLIC PANEL - Abnormal; Notable for the following components:      Result Value   Potassium 3.1 (*)    CO2 20 (*)    Glucose,  Bld 133 (*)    BUN 22 (*)    Total Protein 8.5 (*)    Albumin 5.5 (*)    AST 43 (*)    Total Bilirubin 1.5 (*)    Anion gap 19 (*)    All other components within normal limits  CBC - Abnormal; Notable for the following components:   WBC 19.8 (*)    Hemoglobin 15.9 (*)    All other components within normal limits  LIPASE, BLOOD  URINALYSIS, ROUTINE W REFLEX MICROSCOPIC  PREGNANCY, URINE   Procedures Procedures   Medications Ordered in ED Medications  potassium chloride 10 mEq in 100 mL IVPB (10  mEq Intravenous New Bag/Given 03/04/18 0153)  sodium chloride 0.9 % bolus 1,000 mL (1,000 mLs Intravenous New Bag/Given 03/04/18 0156)  ondansetron (ZOFRAN) injection 4 mg (4 mg Intravenous Given 03/03/18 2351)  prochlorperazine (COMPAZINE) injection 10 mg (10 mg Intravenous Given 03/04/18 0113)  diphenhydrAMINE (BENADRYL) injection 25 mg (25 mg Intravenous Given 03/04/18 0138)  morphine 4 MG/ML injection 4 mg (4 mg Intravenous Given 03/04/18 0144)     Initial Impression / Assessment and Plan / ED Course  I have reviewed the triage vital signs and the nursing notes.  Pertinent  results that were available during my care of the patient were reviewed by me and considered in my medical decision making (see chart for details).  Epigastric pain, nausea, vomiting.  Old records are reviewed, and she has multiple ED visits with similar complaints.  She has been hypokalemic in the past and potassium is low today.  She will be given intravenous potassium.  She has received a dose of ondansetron in the ED, but continues to have nausea.  She will be given prochlorperazine and IV fluids.  Labs are also significant for leukocytosis and metabolic acidosis with a slightly elevated anion gap.  These have also been present with previous episodes of emesis.  2:52 AM She is feeling much better.  She is discharged with prescription for prochlorperazine and also for K-Dur, follow-up with PCP.  Return precautions  discussed.  Final Clinical Impressions(s) / ED Diagnoses   Final diagnoses:  Non-intractable cyclical vomiting with nausea  Hypokalemia, gastrointestinal losses    ED Discharge Orders         Ordered    prochlorperazine (COMPAZINE) 10 MG tablet  Every 6 hours PRN     03/04/18 0251    potassium chloride SA (K-DUR,KLOR-CON) 20 MEQ tablet  2 times daily     03/04/18 0251           Dione Booze, MD 03/04/18 0253  Urinalysis has come back with positive nitrite and many bacteria noted in the urine, but only 6-10 WBCs and obviously contaminated specimen.  Clinically, she does not have a urinary tract infection, will send urine for culture and only treat if culture comes back positive.   Dione Booze, MD 03/04/18 [phone]

## 2018-03-07 LAB — URINE CULTURE: Culture: >=100000 — AB

## 2018-03-08 ENCOUNTER — Telehealth: Payer: Self-pay | Admitting: *Deleted

## 2018-03-08 NOTE — Telephone Encounter (Signed)
Post ED Visit - Positive Culture Follow-up  Culture report reviewed by antimicrobial stewardship pharmacist:  []  Enzo BiNathan Batchelder, Pharm.D. []  Celedonio MiyamotoJeremy Frens, Pharm.D., BCPS AQ-ID []  Garvin FilaMike Maccia, Pharm.D., BCPS []  Georgina PillionElizabeth Martin, Pharm.D., BCPS []  RemyMinh Pham, [address] BoulevardPharm.D., BCPS, AAHIVP []  Estella HuskMichelle Turner, Pharm.D., BCPS, AAHIVP []  Lysle Pearlachel Rumbarger, PharmD, BCPS []  Phillips Climeshuy Dang, PharmD, BCPS []  Agapito GamesAlison Masters, PharmD, BCPS []  Verlan FriendsErin Deja, PharmD  Positive urine culture, reviewed by Dietrich PatesHina Khatri, PA-C Improved clinical status after IV fluids and no further patient follow-up is required at this time.  Virl AxeRobertson, Dariyon [facility] LLCalley 03/08/2018, 4:11 PM

## 2018-04-13 ENCOUNTER — Encounter (HOSPITAL_BASED_OUTPATIENT_CLINIC_OR_DEPARTMENT_OTHER): Payer: Self-pay | Admitting: *Deleted

## 2018-04-13 ENCOUNTER — Other Ambulatory Visit: Payer: Self-pay

## 2018-04-13 ENCOUNTER — Emergency Department (HOSPITAL_BASED_OUTPATIENT_CLINIC_OR_DEPARTMENT_OTHER)
Admission: EM | Admit: 2018-04-13 | Discharge: 2018-04-13 | Disposition: A | Discharge status: Home / Self Care | Payer: Medicaid Other | Attending: Emergency Medicine | Admitting: Emergency Medicine

## 2018-04-13 DIAGNOSIS — G43A0 Cyclical vomiting, not intractable: Secondary | ICD-10-CM | POA: Diagnosis not present

## 2018-04-13 DIAGNOSIS — Z79899 Other long term (current) drug therapy: Secondary | ICD-10-CM | POA: Insufficient documentation

## 2018-04-13 DIAGNOSIS — R112 Nausea with vomiting, unspecified: Secondary | ICD-10-CM | POA: Diagnosis present

## 2018-04-13 DIAGNOSIS — F17210 Nicotine dependence, cigarettes, uncomplicated: Secondary | ICD-10-CM | POA: Insufficient documentation

## 2018-04-13 LAB — HEPATIC FUNCTION PANEL
ALBUMIN: 5.4 g/dL — AB (ref 3.5–5.0)
ALT: 14 U/L (ref 0–44)
AST: 32 U/L (ref 15–41)
Alkaline Phosphatase: 63 U/L (ref 38–126)
Bilirubin, Direct: 0.2 mg/dL (ref 0.0–0.2)
Indirect Bilirubin: 0.6 mg/dL (ref 0.3–0.9)
TOTAL PROTEIN: 8.2 g/dL — AB (ref 6.5–8.1)
Total Bilirubin: 0.8 mg/dL (ref 0.3–1.2)

## 2018-04-13 LAB — CBC
HEMATOCRIT: 44.9 % (ref 36.0–46.0)
Hemoglobin: 15.7 g/dL — ABNORMAL HIGH (ref 12.0–15.0)
MCH: 32.5 pg (ref 26.0–34.0)
MCHC: 35 g/dL (ref 30.0–36.0)
MCV: 93 fL (ref 80.0–100.0)
NRBC: 0 % (ref 0.0–0.2)
PLATELETS: 256 10*3/uL (ref 150–400)
RBC: 4.83 MIL/uL (ref 3.87–5.11)
RDW: 11.9 % (ref 11.5–15.5)
WBC: 17.4 10*3/uL — AB (ref 4.0–10.5)

## 2018-04-13 LAB — BASIC METABOLIC PANEL
ANION GAP: 17 — AB (ref 5–15)
BUN: 13 mg/dL (ref 6–20)
CHLORIDE: 103 mmol/L (ref 98–111)
CO2: 19 mmol/L — ABNORMAL LOW (ref 22–32)
Calcium: 10.2 mg/dL (ref 8.9–10.3)
Creatinine, Ser: 0.78 mg/dL (ref 0.44–1.00)
GFR calc Af Amer: >60 mL/min (ref >60)
GFR calc non Af Amer: >60 mL/min (ref >60)
GLUCOSE: 162 mg/dL — AB (ref 70–99)
POTASSIUM: 3.7 mmol/L (ref 3.5–5.1)
Sodium: 139 mmol/L (ref 135–145)

## 2018-04-13 MED ORDER — PROMETHAZINE HCL 12.5 MG RE SUPP: 12.5000 mg | Freq: Four times a day (QID) | RECTAL | 0 refills | Status: DC | PRN

## 2018-04-13 MED ORDER — SODIUM CHLORIDE 0.9 % IV SOLN
INTRAVENOUS | Status: DC | PRN
  Administered 2018-04-13: 500 mL via INTRAVENOUS

## 2018-04-13 MED ORDER — PROCHLORPERAZINE EDISYLATE 10 MG/2ML IJ SOLN
5.0000 mg | Freq: Once | INTRAMUSCULAR | Status: AC
  Administered 2018-04-13: 5 mg via INTRAVENOUS
  Filled 2018-04-13: qty 2

## 2018-04-13 MED ORDER — HALOPERIDOL LACTATE 5 MG/ML IJ SOLN
2.0000 mg | Freq: Once | INTRAMUSCULAR | Status: AC
  Administered 2018-04-13: 2 mg via INTRAVENOUS
  Filled 2018-04-13: qty 1

## 2018-04-13 MED ORDER — PROMETHAZINE HCL 25 MG/ML IJ SOLN: 6.2500 mg | Freq: Once | INTRAMUSCULAR | Status: DC

## 2018-04-13 MED ORDER — SODIUM CHLORIDE 0.9 % IV BOLUS
1000.0000 mL | Freq: Once | INTRAVENOUS | Status: AC
  Administered 2018-04-13: 1000 mL via INTRAVENOUS

## 2018-04-13 MED ORDER — FAMOTIDINE IN NACL 20-0.9 MG/50ML-% IV SOLN
20.0000 mg | Freq: Once | INTRAVENOUS | Status: AC
  Administered 2018-04-13: 20 mg via INTRAVENOUS
  Filled 2018-04-13: qty 50

## 2018-04-13 MED ORDER — DIPHENHYDRAMINE HCL 50 MG/ML IJ SOLN
12.5000 mg | Freq: Once | INTRAMUSCULAR | Status: DC
  Filled 2018-04-13: qty 1

## 2018-04-13 NOTE — ED Provider Notes (Signed)
[facility] EMERGENCY DEPARTMENT Provider Note   CSN: [id number] Arrival date & time: 04/13/18  1452     History   Chief Complaint Chief Complaint  Patient presents with  . Emesis    HPI Tiffany Williamson is a 22 y.o. female with a PMH of cyclic vomiting syndrome and marijuana use who presents with nausea, vomiting, and diarrhea.  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  She says she has vomited about 40 times today, with the vomitus containing stomach contents with some streaks of blood.  She thinks this episode is different from her other episodes of nausea and vomiting because her abdominal pain is more intense this time.  She has a headache but denies other symptoms.  She denies marijuana or other drug use.   Past Medical History:  Diagnosis Date  . Chronic UTI   . Depression   . Drug abuse, marijuana   . Hyperemesis   . Migraine     Patient Active Problem List   Diagnosis Date Noted  . Bacterial vaginosis 01/05/2018  . Encounter to establish care 09/14/2017  . Tobacco use disorder 09/14/2017  . Marijuana use 09/14/2017  . Nausea and vomiting 04/25/2017  . Cyclical vomiting associated with migraine 10/28/2016  . Depression 10/28/2016  . Persistent proteinuria 10/28/2016  . Flank pain 10/28/2016  . Hypokalemia 10/28/2016    History reviewed. No pertinent surgical history.   OB History   None      Home Medications    Prior to Admission medications   Medication Sig Start Date End Date Taking? Authorizing Provider  cetirizine-pseudoephedrine (ZYRTEC-D) 5-120 MG tablet Take 1 tablet by mouth daily. 09/12/17   Cathie Hoops, Amy V, PA-C  chlorproMAZINE (THORAZINE) 25 MG tablet Take 1 tablet (25 mg total) by mouth 3 (three) times daily as needed for nausea or vomiting. 06/27/17   Gwyneth Sprout, MD  FLUoxetine (PROZAC) 20 MG capsule Take 20 mg by mouth daily.     [provider]  FLUoxetine (PROZAC) 20 MG capsule Take by mouth.    [provider]  fluticasone (FLONASE) 50 MCG/ACT nasal spray Place 2 sprays into both nostrils daily. 09/12/17   Cathie Hoops, Amy V, PA-C  ipratropium (ATROVENT) 0.06 % nasal spray Place 2 sprays into both nostrils 4 (four) times daily. 09/12/17   Cathie Hoops, Amy V, PA-C  meloxicam (MOBIC) 7.5 MG tablet Take 1 tablet (7.5 mg total) by mouth daily. 09/12/17   Cathie Hoops, Amy V, PA-C  metoCLOPramide (REGLAN) 10 MG tablet Take 1 tablet (10 mg total) by mouth every 8 (eight) hours as needed for nausea. 01/01/18   Benjiman Core, MD  nicotine (NICODERM CQ - DOSED IN MG/24 HOURS) 14 mg/24hr patch Place 1 patch (14 mg total) onto the skin daily. 09/14/17   Bland, Scott, DO  ondansetron (ZOFRAN ODT) 4 MG disintegrating tablet Take 1 tablet (4 mg total) by mouth every 8 (eight) hours as needed for nausea or vomiting. 08/13/17   Little, Ambrose Finland, MD  ondansetron [facility]) 4 MG/5ML solution Take 10 mLs (8 mg total) by mouth every 8 (eight) hours as needed for nausea or vomiting. 01/05/18   Garnette Gunner, MD  pantoprazole (PROTONIX) 40 MG tablet Take 1 tablet (40 mg total) by mouth daily. 10/30/16   Arnetha Courser, MD  potassium chloride SA (K-DUR,KLOR-CON) 20 MEQ tablet Take 1 tablet (20 mEq total) by mouth 2 (two) times  daily. 03/04/18   Dione Booze, MD  prochlorperazine (COMPAZINE) 10 MG tablet Take 1 tablet (10 mg total) by mouth every 6 (six) hours as needed for nausea or vomiting. 03/04/18   Dione Booze, MD  promethazine (PHENERGAN) 25 MG suppository Place 1 suppository (25 mg total) rectally every 6 (six) hours as needed for nausea or vomiting. 12/10/17   Pricilla Loveless, MD  promethazine (PHENERGAN) 25 MG tablet Take 1 tablet (25 mg total) by mouth every 8 (eight) hours as needed for nausea or vomiting. 11/18/17   Tilden Fossa, MD  saccharomyces boulardii (FLORASTOR) 250 MG capsule Take 1 capsule (250 mg total) by mouth 2 (two) times daily.  04/26/17   Meredeth Ide, MD    Family History Family History  Problem Relation Age of Onset  . Healthy Mother   . Breast cancer Maternal Grandmother     Social History Social History   Tobacco Use  . Smoking status: Current Every Day Smoker    Packs/day: 0.50    Types: Cigarettes  . Smokeless tobacco: Never Used  Substance Use Topics  . Alcohol use: Not Currently    Comment: occ  . Drug use: Not Currently    Types: Marijuana    Comment: last used 2 weeks ago     Allergies   Cinnamon; Reglan [metoclopramide]; and Nickel   Review of Systems Review of Systems  Constitutional: Positive for activity change. Negative for fever.  HENT: Positive for sore throat. Negative for congestion.   Respiratory: Negative for cough.   Gastrointestinal: Positive for abdominal pain, diarrhea, nausea and vomiting.  Genitourinary: Negative for dysuria.  Musculoskeletal: Negative for arthralgias.  Neurological: Positive for headaches.  Psychiatric/Behavioral: Negative for behavioral problems.     Physical Exam Updated Vital Signs BP 121/77   Pulse (!) 101   Temp 98 F (36.7 C) (Oral)   Resp 18   Ht 5\' 3"  (1.6 m)   Wt 43.2 kg   SpO2 100%   BMI 16.87 kg/m   Physical Exam  Constitutional: She is oriented to person, place, and time. She appears well-developed and well-nourished. No distress.  HENT:  Head: Normocephalic and atraumatic.  Right Ear: External ear normal.  Left Ear: External ear normal.  Tonsillolith on right side of oropharynx; dry mucous membranes  Eyes: Pupils are equal, round, and reactive to light. Conjunctivae and EOM are normal.  Neck: Normal range of motion.  Cardiovascular: Normal rate and regular rhythm.  No murmur heard. Pulmonary/Chest: Effort normal and breath sounds normal. No respiratory distress.  Abdominal: Soft. Bowel sounds are normal. She exhibits no distension. There is tenderness.  Musculoskeletal: Normal range of motion. She exhibits no  edema or deformity.  Neurological: She is alert and oriented to person, place, and time.  Skin: Skin is warm and dry.  Psychiatric: She has a normal mood and affect.     ED Treatments / Results  Labs (all labs ordered are listed, but only abnormal results are displayed) Labs Reviewed  BASIC METABOLIC PANEL - Abnormal; Notable for the following components:      Result Value   CO2 19 (*)    Glucose, Bld 162 (*)    Anion gap 17 (*)    All other components within normal limits  HEPATIC FUNCTION PANEL - Abnormal; Notable for the following components:   Total Protein 8.2 (*)    Albumin 5.4 (*)    All other components within normal limits  CBC - Abnormal; Notable for the following components:  WBC 17.4 (*)    Hemoglobin 15.7 (*)    All other components within normal limits  URINALYSIS, ROUTINE W REFLEX MICROSCOPIC  PREGNANCY, URINE    EKG EKG Interpretation  Date/Time:  Friday April 13 2018 15:21:23 EDT Ventricular Rate:  79 PR Interval:  144 QRS Duration: 86 QT Interval:  404 QTC Calculation: 463 R Axis:   89 Text Interpretation:  Sinus rhythm with marked sinus arrhythmia Right atrial enlargement Borderline ECG possible u wave Otherwise no significant change Confirmed by Melene Plan [phone]) on 04/13/2018 4:19:48 PM Also confirmed by Melene Plan [phone]), editor Barbette Hair [phone])  on 04/13/2018 4:36:29 PM   Radiology No results found.  Procedures Procedures (including critical care time)  Medications Ordered in ED Medications  famotidine (PEPCID) IVPB 20 mg premix (20 mg Intravenous New Bag/Given 04/13/18 1722)  0.9 %  sodium chloride infusion (500 mLs Intravenous New Bag/Given 04/13/18 1722)  sodium chloride 0.9 % bolus 1,000 mL (0 mLs Intravenous Stopped 04/13/18 1712)  prochlorperazine (COMPAZINE) injection 5 mg (5 mg Intravenous Given 04/13/18 1620)  haloperidol lactate (HALDOL) injection 2 mg (2 mg Intravenous Given 04/13/18 1705)     Initial Impression /  Assessment and Plan / ED Course  I have reviewed the triage vital signs and the nursing notes.  Pertinent labs & imaging results that were available during my care of the patient were reviewed by me and considered in my medical decision making (see chart for details).     Cyclical nausea and vomiting d/t migraine and/or marijuana use: Symptoms are very similar to previous episodes.  Viral illness is possible, but only one episode of diarrhea makes this unlikely.  Will obtain CMP, CBC, urine pregnancy and give a one liter fluid bolus and compazine for symptom relief.  Labs are reassuring. Patient's nurse reports that patient only vomits when she is in the bathroom alone, so there is concern for self-induced vomiting.    Due to continued vomiting after compazine, will give Haldol 2 mg.  Patient feels well enough to be discharged home.  Patient advised to use antacids for stomach pain and to drink small sips of fluids.  Final Clinical Impressions(s) / ED Diagnoses   Final diagnoses:  Nausea and vomiting in adult  Cyclical vomiting associated with nonintractable migraine    ED Discharge Orders    None       Lennox Solders, MD 04/13/18 1731    Melene Plan, DO 04/13/18 1953

## 2018-04-13 NOTE — ED Triage Notes (Signed)
Vomiting and diarrhea 

## 2018-04-13 NOTE — Discharge Instructions (Signed)
Return for inability to eat or drink or worsening abdominal pain. Feel free to return at any time.

## 2018-04-13 NOTE — ED Notes (Signed)
Patient states "if I dont produce a urine in time can I sign myself out".  Patient notified that she is able to do this if she would like but that we would like to check for infection in her urine.  Patient states she does not have any symptoms and doesn't believe she needs medicine for this.  Patient states I need pepcid through my IV.  EDP made aware.

## 2018-04-13 NOTE — ED Notes (Signed)
Pt is ambulatory to the bathroom several times. Dry heaves. Unable to urinate.

## 2018-04-13 NOTE — ED Notes (Signed)
Patient unable to provide urine sample at this time

## 2018-04-13 NOTE — ED Notes (Signed)
Pt states " I am shaking because of my anxiety and I cannot breathe, and my chest hurts." RN made aware and EKG done.

## 2018-06-16 ENCOUNTER — Emergency Department (HOSPITAL_BASED_OUTPATIENT_CLINIC_OR_DEPARTMENT_OTHER)
Admission: EM | Admit: 2018-06-16 | Discharge: 2018-06-16 | Discharge status: Against Medical Advice | Payer: Medicaid Other | Attending: Emergency Medicine | Admitting: Emergency Medicine

## 2018-06-16 ENCOUNTER — Encounter (HOSPITAL_BASED_OUTPATIENT_CLINIC_OR_DEPARTMENT_OTHER): Payer: Self-pay | Admitting: *Deleted

## 2018-06-16 ENCOUNTER — Other Ambulatory Visit: Payer: Self-pay

## 2018-06-16 DIAGNOSIS — G43A0 Cyclical vomiting, not intractable: Secondary | ICD-10-CM | POA: Insufficient documentation

## 2018-06-16 DIAGNOSIS — Z79899 Other long term (current) drug therapy: Secondary | ICD-10-CM | POA: Insufficient documentation

## 2018-06-16 DIAGNOSIS — F329 Major depressive disorder, single episode, unspecified: Secondary | ICD-10-CM | POA: Insufficient documentation

## 2018-06-16 DIAGNOSIS — F17210 Nicotine dependence, cigarettes, uncomplicated: Secondary | ICD-10-CM | POA: Diagnosis not present

## 2018-06-16 DIAGNOSIS — R1115 Cyclical vomiting syndrome unrelated to migraine: Secondary | ICD-10-CM

## 2018-06-16 DIAGNOSIS — F1210 Cannabis abuse, uncomplicated: Secondary | ICD-10-CM | POA: Diagnosis not present

## 2018-06-16 DIAGNOSIS — R112 Nausea with vomiting, unspecified: Secondary | ICD-10-CM | POA: Diagnosis present

## 2018-06-16 LAB — COMPREHENSIVE METABOLIC PANEL
ALT: 12 U/L (ref 0–44)
AST: 26 U/L (ref 15–41)
Albumin: 5.4 g/dL — ABNORMAL HIGH (ref 3.5–5.0)
Alkaline Phosphatase: 59 U/L (ref 38–126)
Anion gap: 13 (ref 5–15)
BUN: 10 mg/dL (ref 6–20)
CO2: 22 mmol/L (ref 22–32)
CREATININE: 0.74 mg/dL (ref 0.44–1.00)
Calcium: 10.5 mg/dL — ABNORMAL HIGH (ref 8.9–10.3)
Chloride: 99 mmol/L (ref 98–111)
GFR calc Af Amer: >60 mL/min (ref >60)
GFR calc non Af Amer: >60 mL/min (ref >60)
Glucose, Bld: 165 mg/dL — ABNORMAL HIGH (ref 70–99)
Potassium: 3.2 mmol/L — ABNORMAL LOW (ref 3.5–5.1)
Sodium: 134 mmol/L — ABNORMAL LOW (ref 135–145)
Total Bilirubin: 1.1 mg/dL (ref 0.3–1.2)
Total Protein: 8.3 g/dL — ABNORMAL HIGH (ref 6.5–8.1)

## 2018-06-16 LAB — HCG, QUANTITATIVE, PREGNANCY: hCG, Beta Chain, Quant, S: <1 m[IU]/mL (ref <5)

## 2018-06-16 LAB — CBC WITH DIFFERENTIAL/PLATELET
Abs Immature Granulocytes: 0.07 10*3/uL (ref 0.00–0.07)
Basophils Absolute: 0 10*3/uL (ref 0.0–0.1)
Basophils Relative: 0 %
Eosinophils Absolute: 0 10*3/uL (ref 0.0–0.5)
Eosinophils Relative: 0 %
HCT: 43.5 % (ref 36.0–46.0)
Hemoglobin: 15.2 g/dL — ABNORMAL HIGH (ref 12.0–15.0)
Immature Granulocytes: 1 %
Lymphocytes Relative: 9 %
Lymphs Abs: 1.3 10*3/uL (ref 0.7–4.0)
MCH: 31.5 pg (ref 26.0–34.0)
MCHC: 34.9 g/dL (ref 30.0–36.0)
MCV: 90.2 fL (ref 80.0–100.0)
MONOS PCT: 5 %
Monocytes Absolute: 0.8 10*3/uL (ref 0.1–1.0)
Neutro Abs: 12.6 10*3/uL — ABNORMAL HIGH (ref 1.7–7.7)
Neutrophils Relative %: 85 %
Platelets: 283 10*3/uL (ref 150–400)
RBC: 4.82 MIL/uL (ref 3.87–5.11)
RDW: 11.9 % (ref 11.5–15.5)
WBC: 14.7 10*3/uL — ABNORMAL HIGH (ref 4.0–10.5)
nRBC: 0 % (ref 0.0–0.2)

## 2018-06-16 LAB — LIPASE, BLOOD: Lipase: 22 U/L (ref 11–51)

## 2018-06-16 MED ORDER — FAMOTIDINE IN NACL 20-0.9 MG/50ML-% IV SOLN
20.0000 mg | Freq: Once | INTRAVENOUS | Status: AC
  Administered 2018-06-16: 20 mg via INTRAVENOUS
  Filled 2018-06-16: qty 50

## 2018-06-16 MED ORDER — SODIUM CHLORIDE 0.9 % IV BOLUS
1000.0000 mL | Freq: Once | INTRAVENOUS | Status: AC
  Administered 2018-06-16: 1000 mL via INTRAVENOUS

## 2018-06-16 MED ORDER — PROCHLORPERAZINE EDISYLATE 10 MG/2ML IJ SOLN
5.0000 mg | Freq: Once | INTRAMUSCULAR | Status: AC
  Administered 2018-06-16: 5 mg via INTRAVENOUS
  Filled 2018-06-16: qty 2

## 2018-06-16 MED ORDER — PROCHLORPERAZINE EDISYLATE 10 MG/2ML IJ SOLN: 10.0000 mg | Freq: Once | INTRAMUSCULAR | Status: DC

## 2018-06-16 NOTE — ED Triage Notes (Signed)
Vomiting for a day and a half.  Been to Urgent care.

## 2018-06-16 NOTE — ED Notes (Signed)
States," I have to leave" ED PA informed, instructed to return if condition worsens, verbalized understanding

## 2018-06-16 NOTE — ED Provider Notes (Signed)
[facility] EMERGENCY DEPARTMENT Provider Note   CSN: [id number] Arrival date & time: 06/16/18  1022     History   Chief Complaint Chief Complaint  Patient presents with  . Emesis    HPI Tiffany Williamson is a 22 y.o. 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  She also reports intermittent diarrhea.  Patient was told in the past that it was due to her marijuana use but states that "I have not smoked in like a month and a half."  She denies any sick contacts with similar symptoms.  She has not tried any medications at home to help with pain.  Denies any urinary symptoms, vaginal complaints, fever, prior abdominal surgeries, alcohol or other drug use.  She does endorse tobacco use.  Denies chronic NSAID use.  Denies possibility of pregnancy.  HPI  Past Medical History:  Diagnosis Date  . Chronic UTI   . Depression   . Drug abuse, marijuana   . Hyperemesis   . Migraine     Patient Active Problem List   Diagnosis Date Noted  . Bacterial vaginosis 01/05/2018  . Encounter to establish care 09/14/2017  . Tobacco use disorder 09/14/2017  . Marijuana use 09/14/2017  . Nausea and vomiting 04/25/2017  . Cyclical vomiting associated with migraine 10/28/2016  . Depression 10/28/2016  . Persistent proteinuria 10/28/2016  . Flank pain 10/28/2016  . Hypokalemia 10/28/2016    History reviewed. No pertinent surgical history.   OB History   No obstetric history on file.      Home Medications    Prior to Admission medications   Medication Sig Start Date End Date Taking? Authorizing Provider  chlorproMAZINE (THORAZINE) 25 MG tablet Take 1 tablet (25 mg total) by mouth 3 (three) times daily as needed for nausea or vomiting. 06/27/17  Yes Plunkett, Alphonzo LemmingsWhitney, MD  FLUoxetine (PROZAC) 20 MG capsule Take 20 mg by  mouth daily.   Yes [provider]  cetirizine-pseudoephedrine (ZYRTEC-D) 5-120 MG tablet Take 1 tablet by mouth daily. 09/12/17   Cathie HoopsYu, Amy V, PA-C  FLUoxetine (PROZAC) 20 MG capsule Take by mouth.    [provider]  fluticasone (FLONASE) 50 MCG/ACT nasal spray Place 2 sprays into both nostrils daily. 09/12/17   Cathie HoopsYu, Amy V, PA-C  ipratropium (ATROVENT) 0.06 % nasal spray Place 2 sprays into both nostrils 4 (four) times daily. 09/12/17   Cathie HoopsYu, Amy V, PA-C  meloxicam (MOBIC) 7.5 MG tablet Take 1 tablet (7.5 mg total) by mouth daily. 09/12/17   Cathie HoopsYu, Amy V, PA-C  metoCLOPramide (REGLAN) 10 MG tablet Take 1 tablet (10 mg total) by mouth every 8 (eight) hours as needed for nausea. 01/01/18   Benjiman CorePickering, Nathan, MD  nicotine (NICODERM CQ - DOSED IN MG/24 HOURS) 14 mg/24hr patch Place 1 patch (14 mg total) onto the skin daily. 09/14/17   Bland, Scott, DO  ondansetron (ZOFRAN ODT) 4 MG disintegrating tablet Take 1 tablet (4 mg total) by mouth every 8 (eight) hours as needed for nausea or vomiting. 08/13/17   Little, Ambrose Finlandachel Morgan, MD  ondansetron [facility](ZOFRAN) 4 MG/5ML solution Take 10 mLs (8 mg total) by mouth every 8 (eight) hours as needed for nausea or vomiting. 01/05/18   Garnette Gunnerhompson, Aaron B, MD  pantoprazole (PROTONIX) 40 MG tablet Take 1 tablet (40 mg total) by mouth daily. 10/30/16  Arnetha CourserAmin, Sumayya, MD  potassium chloride SA (K-DUR,KLOR-CON) 20 MEQ tablet Take 1 tablet (20 mEq total) by mouth 2 (two) times daily. 03/04/18   Dione BoozeGlick, David, MD  prochlorperazine (COMPAZINE) 10 MG tablet Take 1 tablet (10 mg total) by mouth every 6 (six) hours as needed for nausea or vomiting. 03/04/18   Dione BoozeGlick, David, MD  promethazine (PHENERGAN) 12.5 MG suppository Place 1 suppository (12.5 mg total) rectally every 6 (six) hours as needed for nausea or vomiting. 04/13/18   Lennox SoldersWinfrey, Amanda C, MD  saccharomyces boulardii (FLORASTOR) 250 MG capsule Take 1 capsule (250 mg total) by mouth 2 (two) times daily. 04/26/17   Meredeth IdeLama, Gagan S, MD      Family History Family History  Problem Relation Age of Onset  . Healthy Mother   . Breast cancer Maternal Grandmother     Social History Social History   Tobacco Use  . Smoking status: Current Every Day Smoker    Packs/day: 0.50    Types: Cigarettes  . Smokeless tobacco: Never Used  Substance Use Topics  . Alcohol use: Not Currently    Comment: occ  . Drug use: Not Currently    Types: Marijuana    Comment: a month and half ago     Allergies   Cinnamon; Reglan [metoclopramide]; and Nickel   Review of Systems Review of Systems  Constitutional: Negative for appetite change, chills and fever.  HENT: Negative for ear pain, rhinorrhea, sneezing and sore throat.   Eyes: Negative for photophobia and visual disturbance.  Respiratory: Negative for cough, chest tightness, shortness of breath and wheezing.   Cardiovascular: Negative for chest pain and palpitations.  Gastrointestinal: Positive for abdominal pain, diarrhea, nausea and vomiting. Negative for blood in stool and constipation.  Genitourinary: Negative for dysuria, hematuria and urgency.  Musculoskeletal: Negative for myalgias.  Skin: Negative for rash.  Neurological: Negative for dizziness, weakness and light-headedness.     Physical Exam Updated Vital Signs BP (!) 135/94 (BP Location: Right Arm)   Pulse 88   Temp 98 F (36.7 C) (Oral)   Resp 18   Ht 5\' 3"  (1.6 m)   Wt 43.5 kg   SpO2 100%   BMI 17.01 kg/m   Physical Exam Vitals signs and nursing note reviewed.  Constitutional:      General: She is not in acute distress.    Appearance: She is well-developed.  HENT:     Head: Normocephalic and atraumatic.     Nose: Nose normal.  Eyes:     General: No scleral icterus.       Right eye: No discharge.        Left eye: No discharge.     Conjunctiva/sclera: Conjunctivae normal.  Neck:     Musculoskeletal: Normal range of motion and neck supple.  Cardiovascular:     Rate and Rhythm: Normal rate and  regular rhythm.     Heart sounds: Normal heart sounds. No murmur. No friction rub. No gallop.   Pulmonary:     Effort: Pulmonary effort is normal. No respiratory distress.     Breath sounds: Normal breath sounds.  Abdominal:     General: Bowel sounds are normal. There is no distension.     Palpations: Abdomen is soft.     Tenderness: There is abdominal tenderness (Generalized). There is no guarding or rebound.  Musculoskeletal: Normal range of motion.  Skin:    General: Skin is warm and dry.     Findings: No rash.  Neurological:  Mental Status: She is alert.     Motor: No abnormal muscle tone.     Coordination: Coordination normal.      ED Treatments / Results  Labs (all labs ordered are listed, but only abnormal results are displayed) Labs Reviewed  COMPREHENSIVE METABOLIC PANEL - Abnormal; Notable for the following components:      Result Value   Sodium 134 (*)    Potassium 3.2 (*)    Glucose, Bld 165 (*)    Calcium 10.5 (*)    Total Protein 8.3 (*)    Albumin 5.4 (*)    All other components within normal limits  CBC WITH DIFFERENTIAL/PLATELET - Abnormal; Notable for the following components:   WBC 14.7 (*)    Hemoglobin 15.2 (*)    Neutro Abs 12.6 (*)    All other components within normal limits  LIPASE, BLOOD  URINALYSIS, ROUTINE W REFLEX MICROSCOPIC  HCG, QUANTITATIVE, PREGNANCY    EKG None  Radiology No results found.  Procedures Procedures (including critical care time)  Medications Ordered in ED Medications  sodium chloride 0.9 % bolus 1,000 mL (1,000 mLs Intravenous New Bag/Given 06/16/18 1121)  prochlorperazine (COMPAZINE) injection 5 mg (5 mg Intravenous Given 06/16/18 1125)  famotidine (PEPCID) IVPB 20 mg premix (0 mg Intravenous Stopped 06/16/18 1210)     Initial Impression / Assessment and Plan / ED Course  I have reviewed the triage vital signs and the nursing notes.  Pertinent labs & imaging results that were available during my care  of the patient were reviewed by me and considered in my medical decision making (see chart for details).     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  States that she has not used marijuana in a month and a half.  Also reports generalized abdominal pain.  No sick contacts with similar symptoms.  On exam patient has generalized abdominal tenderness to palpation without rebound or guarding noted.  She is afebrile.  Other vital signs within normal limits.  Plan is to obtain lab work, give fluids and medications and reassess.  I have discussed my concerns as a provider and the possibility that this may worsen. We discussed the nature, risks and benefits, and alternatives to treatment. I have specifically discussed that without further evaluation I cannot guarantee there is not a life threatening event occuring.  Time was given to allow the opportunity to ask questions and consider the options, and after the discussion, the patient decided to refuse the offered treatment. Patient is alert and oriented x4, their own POA and states understanding of my concerns and the possible consequences. After refusal, I made every reasonable opportunity to treat them to the best of my ability. I have made the patient aware that this is an AMA discharge, but she may return at any time for further evaluation and treatment.    Portions of this note were generated with Scientist, clinical (histocompatibility and immunogenetics). Dictation errors may occur despite best attempts at proofreading.  Final Clinical Impressions(s) / ED Diagnoses   Final diagnoses:  Cyclical vomiting    ED Discharge Orders    None       Dietrich Pates, PA-C 06/16/18 1220    Shaune Pollack, MD 06/16/18 1944

## 2019-04-22 ENCOUNTER — Emergency Department (HOSPITAL_BASED_OUTPATIENT_CLINIC_OR_DEPARTMENT_OTHER)
Admission: EM | Admit: 2019-04-22 | Discharge: 2019-04-22 | Disposition: A | Discharge status: Home / Self Care | Payer: Medicaid Other | Attending: Emergency Medicine | Admitting: Emergency Medicine

## 2019-04-22 ENCOUNTER — Encounter (HOSPITAL_BASED_OUTPATIENT_CLINIC_OR_DEPARTMENT_OTHER): Payer: Self-pay

## 2019-04-22 ENCOUNTER — Other Ambulatory Visit: Payer: Self-pay

## 2019-04-22 DIAGNOSIS — Z5321 Procedure and treatment not carried out due to patient leaving prior to being seen by health care provider: Secondary | ICD-10-CM | POA: Insufficient documentation

## 2019-04-22 DIAGNOSIS — R112 Nausea with vomiting, unspecified: Secondary | ICD-10-CM | POA: Insufficient documentation

## 2019-04-22 DIAGNOSIS — R109 Unspecified abdominal pain: Secondary | ICD-10-CM | POA: Insufficient documentation

## 2019-04-22 MED ORDER — SODIUM CHLORIDE 0.9% FLUSH
3.0000 mL | Freq: Once | INTRAVENOUS | Status: DC
  Filled 2019-04-22: qty 3

## 2019-04-22 NOTE — ED Notes (Signed)
PT PAGED IN LOBBY. NO ANSWER 

## 2019-04-22 NOTE — ED Notes (Signed)
Pt not in ED WR or BR-emesis bag and CCUA kit with her label found in ED WR 

## 2019-04-22 NOTE — ED Triage Notes (Signed)
Pt c/o abd pain n/v/d started last night-steady gait hunched over-dry heaving in triage

## 2019-10-04 ENCOUNTER — Emergency Department (HOSPITAL_BASED_OUTPATIENT_CLINIC_OR_DEPARTMENT_OTHER)
Admission: EM | Admit: 2019-10-04 | Discharge: 2019-10-04 | Disposition: A | Discharge status: Home / Self Care | Payer: Medicaid Other | Attending: Emergency Medicine | Admitting: Emergency Medicine

## 2019-10-04 ENCOUNTER — Encounter (HOSPITAL_BASED_OUTPATIENT_CLINIC_OR_DEPARTMENT_OTHER): Payer: Self-pay | Admitting: *Deleted

## 2019-10-04 ENCOUNTER — Other Ambulatory Visit: Payer: Self-pay

## 2019-10-04 DIAGNOSIS — R112 Nausea with vomiting, unspecified: Secondary | ICD-10-CM | POA: Diagnosis not present

## 2019-10-04 DIAGNOSIS — E860 Dehydration: Secondary | ICD-10-CM | POA: Insufficient documentation

## 2019-10-04 DIAGNOSIS — R197 Diarrhea, unspecified: Secondary | ICD-10-CM | POA: Insufficient documentation

## 2019-10-04 DIAGNOSIS — F17210 Nicotine dependence, cigarettes, uncomplicated: Secondary | ICD-10-CM | POA: Diagnosis not present

## 2019-10-04 DIAGNOSIS — R1084 Generalized abdominal pain: Secondary | ICD-10-CM | POA: Insufficient documentation

## 2019-10-04 DIAGNOSIS — Z79899 Other long term (current) drug therapy: Secondary | ICD-10-CM | POA: Diagnosis not present

## 2019-10-04 LAB — CBC WITH DIFFERENTIAL/PLATELET
Abs Immature Granulocytes: 0.06 10*3/uL (ref 0.00–0.07)
Basophils Absolute: 0 10*3/uL (ref 0.0–0.1)
Basophils Relative: 0 %
Eosinophils Absolute: 0 10*3/uL (ref 0.0–0.5)
Eosinophils Relative: 0 %
HCT: 42.4 % (ref 36.0–46.0)
Hemoglobin: 15 g/dL (ref 12.0–15.0)
Immature Granulocytes: 0 %
Lymphocytes Relative: 16 %
Lymphs Abs: 2.8 10*3/uL (ref 0.7–4.0)
MCH: 33 pg (ref 26.0–34.0)
MCHC: 35.4 g/dL (ref 30.0–36.0)
MCV: 93.4 fL (ref 80.0–100.0)
Monocytes Absolute: 1.3 10*3/uL — ABNORMAL HIGH (ref 0.1–1.0)
Monocytes Relative: 7 %
Neutro Abs: 13.6 10*3/uL — ABNORMAL HIGH (ref 1.7–7.7)
Neutrophils Relative %: 77 %
Platelets: 243 10*3/uL (ref 150–400)
RBC: 4.54 MIL/uL (ref 3.87–5.11)
RDW: 12.1 % (ref 11.5–15.5)
WBC: 17.8 10*3/uL — ABNORMAL HIGH (ref 4.0–10.5)
nRBC: 0 % (ref 0.0–0.2)

## 2019-10-04 LAB — COMPREHENSIVE METABOLIC PANEL
ALT: 13 U/L (ref 0–44)
AST: 19 U/L (ref 15–41)
Albumin: 5.3 g/dL — ABNORMAL HIGH (ref 3.5–5.0)
Alkaline Phosphatase: 59 U/L (ref 38–126)
Anion gap: 15 (ref 5–15)
BUN: 19 mg/dL (ref 6–20)
CO2: 20 mmol/L — ABNORMAL LOW (ref 22–32)
Calcium: 9.7 mg/dL (ref 8.9–10.3)
Chloride: 104 mmol/L (ref 98–111)
Creatinine, Ser: 0.81 mg/dL (ref 0.44–1.00)
GFR calc Af Amer: >60 mL/min (ref >60)
GFR calc non Af Amer: >60 mL/min (ref >60)
Glucose, Bld: 143 mg/dL — ABNORMAL HIGH (ref 70–99)
Potassium: 3.1 mmol/L — ABNORMAL LOW (ref 3.5–5.1)
Sodium: 139 mmol/L (ref 135–145)
Total Bilirubin: 1.3 mg/dL — ABNORMAL HIGH (ref 0.3–1.2)
Total Protein: 7.8 g/dL (ref 6.5–8.1)

## 2019-10-04 LAB — URINALYSIS, ROUTINE W REFLEX MICROSCOPIC
Bilirubin Urine: NEGATIVE
Glucose, UA: NEGATIVE mg/dL
Ketones, ur: 40 mg/dL — AB
Leukocytes,Ua: NEGATIVE
Nitrite: NEGATIVE
Protein, ur: 30 mg/dL — AB
Specific Gravity, Urine: >1.03 — ABNORMAL HIGH (ref 1.005–1.030)
pH: 6 (ref 5.0–8.0)

## 2019-10-04 LAB — URINALYSIS, MICROSCOPIC (REFLEX)

## 2019-10-04 LAB — LIPASE, BLOOD: Lipase: 20 U/L (ref 11–51)

## 2019-10-04 LAB — PREGNANCY, URINE: Preg Test, Ur: NEGATIVE

## 2019-10-04 MED ORDER — DROPERIDOL 2.5 MG/ML IJ SOLN
INTRAMUSCULAR | Status: AC
  Filled 2019-10-04: qty 2

## 2019-10-04 MED ORDER — DIPHENHYDRAMINE HCL 50 MG/ML IJ SOLN
25.0000 mg | Freq: Once | INTRAMUSCULAR | Status: AC
  Administered 2019-10-04: 25 mg via INTRAVENOUS

## 2019-10-04 MED ORDER — SODIUM CHLORIDE 0.9 % IV BOLUS
1000.0000 mL | Freq: Once | INTRAVENOUS | Status: AC
  Administered 2019-10-04: 08:00:00 1000 mL via INTRAVENOUS

## 2019-10-04 MED ORDER — ONDANSETRON HCL 4 MG/2ML IJ SOLN: 4.0000 mg | Freq: Once | INTRAMUSCULAR | Status: DC

## 2019-10-04 MED ORDER — DIPHENHYDRAMINE HCL 50 MG/ML IJ SOLN
INTRAMUSCULAR | Status: AC
  Filled 2019-10-04: qty 1

## 2019-10-04 MED ORDER — DROPERIDOL 2.5 MG/ML IJ SOLN
2.5000 mg | Freq: Once | INTRAMUSCULAR | Status: AC
  Administered 2019-10-04: 08:00:00 2.5 mg via INTRAVENOUS

## 2019-10-04 MED ORDER — PROMETHAZINE HCL 12.5 MG RE SUPP: 12.5000 mg | Freq: Four times a day (QID) | RECTAL | 0 refills | Status: DC | PRN

## 2019-10-04 MED ORDER — MORPHINE SULFATE (PF) 4 MG/ML IV SOLN
4.0000 mg | Freq: Once | INTRAVENOUS | Status: AC
  Administered 2019-10-04: 08:00:00 4 mg via INTRAVENOUS
  Filled 2019-10-04: qty 1

## 2019-10-04 NOTE — ED Notes (Signed)
Pt st's unable to void at this time 

## 2019-10-04 NOTE — ED Triage Notes (Signed)
Vomiting x 2 days mid abd pain, diarrhea yesterday

## 2019-10-04 NOTE — ED Provider Notes (Signed)
[facility] EMERGENCY DEPARTMENT Provider Note   CSN: [id number] Arrival date & time: 10/04/19  4098     History Chief Complaint  Patient presents with  . Emesis    Tiffany Williamson is a 24 y.o. female.  Pt presents to the ED today with n/v.  Pt said sx have been going on for 2 days.  She has had some diarrhea as well.  She also c/o her abdomen hurting.          Past Medical History:  Diagnosis Date  . Chronic UTI   . Depression   . Drug abuse, marijuana   . Hyperemesis   . Migraine     Patient Active Problem List   Diagnosis Date Noted  . Bacterial vaginosis 01/05/2018  . Encounter to establish care 09/14/2017  . Tobacco use disorder 09/14/2017  . Marijuana use 09/14/2017  . Nausea and vomiting 04/25/2017  . Cyclical vomiting associated with migraine 10/28/2016  . Depression 10/28/2016  . Persistent proteinuria 10/28/2016  . Flank pain 10/28/2016  . Hypokalemia 10/28/2016    History reviewed. No pertinent surgical history.   OB History   No obstetric history on file.     Family History  Problem Relation Age of Onset  . Healthy Mother   . Breast cancer Maternal Grandmother     Social History   Tobacco Use  . Smoking status: Current Every Day Smoker    Packs/day: 0.50    Types: Cigarettes  . Smokeless tobacco: Never Used  Substance Use Topics  . Alcohol use: Not Currently  . Drug use: Not Currently    Home Medications Prior to Admission medications   Medication Sig Start Date End Date Taking? Authorizing Provider  cetirizine-pseudoephedrine (ZYRTEC-D) 5-120 MG tablet Take 1 tablet by mouth daily. 09/12/17   Cathie Hoops, Amy V, PA-C  chlorproMAZINE (THORAZINE) 25 MG tablet Take 1 tablet (25 mg total) by mouth 3 (three) times daily as needed for nausea or vomiting. 06/27/17   Gwyneth Sprout, MD  FLUoxetine (PROZAC) 20 MG capsule Take 20 mg by mouth daily.    [provider]  FLUoxetine (PROZAC) 20 MG capsule Take by mouth.    [provider]  fluticasone (FLONASE) 50 MCG/ACT nasal spray Place 2 sprays into both nostrils daily. 09/12/17   Cathie Hoops, Amy V, PA-C  ipratropium (ATROVENT) 0.06 % nasal spray Place 2 sprays into both nostrils 4 (four) times daily. 09/12/17   Cathie Hoops, Amy V, PA-C  meloxicam (MOBIC) 7.5 MG tablet Take 1 tablet (7.5 mg total) by mouth daily. 09/12/17   Cathie Hoops, Amy V, PA-C  metoCLOPramide (REGLAN) 10 MG tablet Take 1 tablet (10 mg total) by mouth every 8 (eight) hours as needed for nausea. 01/01/18   Benjiman Core, MD  nicotine (NICODERM CQ - DOSED IN MG/24 HOURS) 14 mg/24hr patch Place 1 patch (14 mg total) onto the skin daily. 09/14/17   Bland, Scott, DO  ondansetron (ZOFRAN ODT) 4 MG disintegrating tablet Take 1 tablet (4 mg total) by mouth every 8 (eight) hours as needed for nausea or vomiting. 08/13/17   Little, Ambrose Finland, MD  ondansetron [facility]) 4 MG/5ML solution Take 10 mLs (8 mg total) by mouth every 8 (eight) hours as needed for nausea or vomiting. 01/05/18   Garnette Gunner, MD  pantoprazole (PROTONIX) 40 MG tablet Take 1 tablet (40 mg total) by mouth daily. 10/30/16   Arnetha Courser, MD  potassium chloride SA (K-DUR,KLOR-CON) 20 MEQ tablet Take 1 tablet (20 mEq  total) by mouth 2 (two) times daily. 03/04/18   Dione Booze, MD  prochlorperazine (COMPAZINE) 10 MG tablet Take 1 tablet (10 mg total) by mouth every 6 (six) hours as needed for nausea or vomiting. 03/04/18   Dione Booze, MD  promethazine (PHENERGAN) 12.5 MG suppository Place 1 suppository (12.5 mg total) rectally every 6 (six) hours as needed for nausea or vomiting. 10/04/19   Jacalyn Lefevre, MD  saccharomyces boulardii (FLORASTOR) 250 MG capsule Take 1 capsule (250 mg total) by mouth 2 (two) times daily. 04/26/17   Meredeth Ide, MD    Allergies    Cinnamon, Reglan [metoclopramide], and Nickel  Review of Systems   Review of Systems  Gastrointestinal: Positive for abdominal pain, nausea and vomiting.  All other systems reviewed and are  negative.   Physical Exam Updated Vital Signs BP (!) 127/105 (BP Location: Right Arm)   Pulse 91   Temp 98 F (36.7 C) (Oral)   Resp 18   Ht 5\' 3"  (1.6 m)   Wt 46.2 kg   SpO2 100%   BMI 18.03 kg/m   Physical Exam Vitals and nursing note reviewed.  Constitutional:      Appearance: She is underweight. She is ill-appearing.     Comments: Pt is actively vomiting on initial exam.  HENT:     Head: Normocephalic and atraumatic.     Right Ear: External ear normal.     Left Ear: External ear normal.     Nose: Nose normal.     Mouth/Throat:     Mouth: Mucous membranes are dry.  Eyes:     Extraocular Movements: Extraocular movements intact.     Conjunctiva/sclera: Conjunctivae normal.     Pupils: Pupils are equal, round, and reactive to light.  Cardiovascular:     Rate and Rhythm: Normal rate and regular rhythm.     Pulses: Normal pulses.     Heart sounds: Normal heart sounds.  Pulmonary:     Effort: Pulmonary effort is normal.     Breath sounds: Normal breath sounds.  Abdominal:     General: Abdomen is flat. Bowel sounds are normal.     Palpations: Abdomen is soft.     Tenderness: There is generalized abdominal tenderness.  Musculoskeletal:        General: Normal range of motion.     Cervical back: Normal range of motion and neck supple.  Skin:    General: Skin is warm.     Capillary Refill: Capillary refill takes less than 2 seconds.  Neurological:     General: No focal deficit present.     Mental Status: She is alert and oriented to person, place, and time.  Psychiatric:        Mood and Affect: Mood normal.        Behavior: Behavior normal.        Thought Content: Thought content normal.        Judgment: Judgment normal.     ED Results / Procedures / Treatments   Labs (all labs ordered are listed, but only abnormal results are displayed) Labs Reviewed  CBC WITH DIFFERENTIAL/PLATELET - Abnormal; Notable for the following components:      Result Value   WBC  17.8 (*)    Neutro Abs 13.6 (*)    Monocytes Absolute 1.3 (*)    All other components within normal limits  COMPREHENSIVE METABOLIC PANEL - Abnormal; Notable for the following components:   Potassium 3.1 (*)    CO2  20 (*)    Glucose, Bld 143 (*)    Albumin 5.3 (*)    Total Bilirubin 1.3 (*)    All other components within normal limits  URINALYSIS, ROUTINE W REFLEX MICROSCOPIC - Abnormal; Notable for the following components:   APPearance HAZY (*)    Specific Gravity, Urine >1.030 (*)    Hgb urine dipstick SMALL (*)    Ketones, ur 40 (*)    Protein, ur 30 (*)    All other components within normal limits  URINALYSIS, MICROSCOPIC (REFLEX) - Abnormal; Notable for the following components:   Bacteria, UA MANY (*)    All other components within normal limits  LIPASE, BLOOD  PREGNANCY, URINE    EKG None  Radiology No results found.  Procedures Procedures (including critical care time)  Medications Ordered in ED Medications  morphine 4 MG/ML injection 4 mg (4 mg Intravenous Given 10/04/19 0811)  sodium chloride 0.9 % bolus 1,000 mL ( Intravenous Stopped 10/04/19 0917)  droperidol (INAPSINE) 2.5 MG/ML injection 2.5 mg (2.5 mg Intravenous Given 10/04/19 0810)  diphenhydrAMINE (BENADRYL) injection 25 mg (25 mg Intravenous Given 10/04/19 9622)    ED Course  I have reviewed the triage vital signs and the nursing notes.  Pertinent labs & imaging results that were available during my care of the patient were reviewed by me and considered in my medical decision making (see chart for details).    MDM Rules/Calculators/A&P                      Pt feels tremendously better after fluids and meds.  The inapsine caused her to feel like her jaw was a little stiff, but that resolved after benadryl.  She is able to tolerate fluids and is ready to go home.  Urine did show some bacteria, but it is not a clean specimen.  LE and nitrites neg.  Pt has no dysuria, so I think it is a contaminant.  WBC  is elevated, but is usually elevated when she comes in.  I think it's reactive.  Abd pain is resolved and no fever.  I don't think she needs any further imaging.  Pt said phenergan suppositories work best, so that is refilled.    Pt is stable for d/c.  Return if worse.  Final Clinical Impression(s) / ED Diagnoses Final diagnoses:  Non-intractable vomiting with nausea, unspecified vomiting type  Dehydration    Rx / DC Orders ED Discharge Orders         Ordered    promethazine (PHENERGAN) 12.5 MG suppository  Every 6 hours PRN     10/04/19 1028           Isla Pence, MD 10/04/19 1031

## 2019-10-26 IMAGING — DX DG CHEST 1V PORT
2 series · 2 of 2 positions shown · non-contrast
Comparison: Chest x-ray dated 10/01/2017.

CLINICAL DATA: Generalized abdominal pain, vomiting and shortness
of breath since early this morning.

EXAM:
PORTABLE CHEST 1 VIEW

[chest ap (1 of 2)]
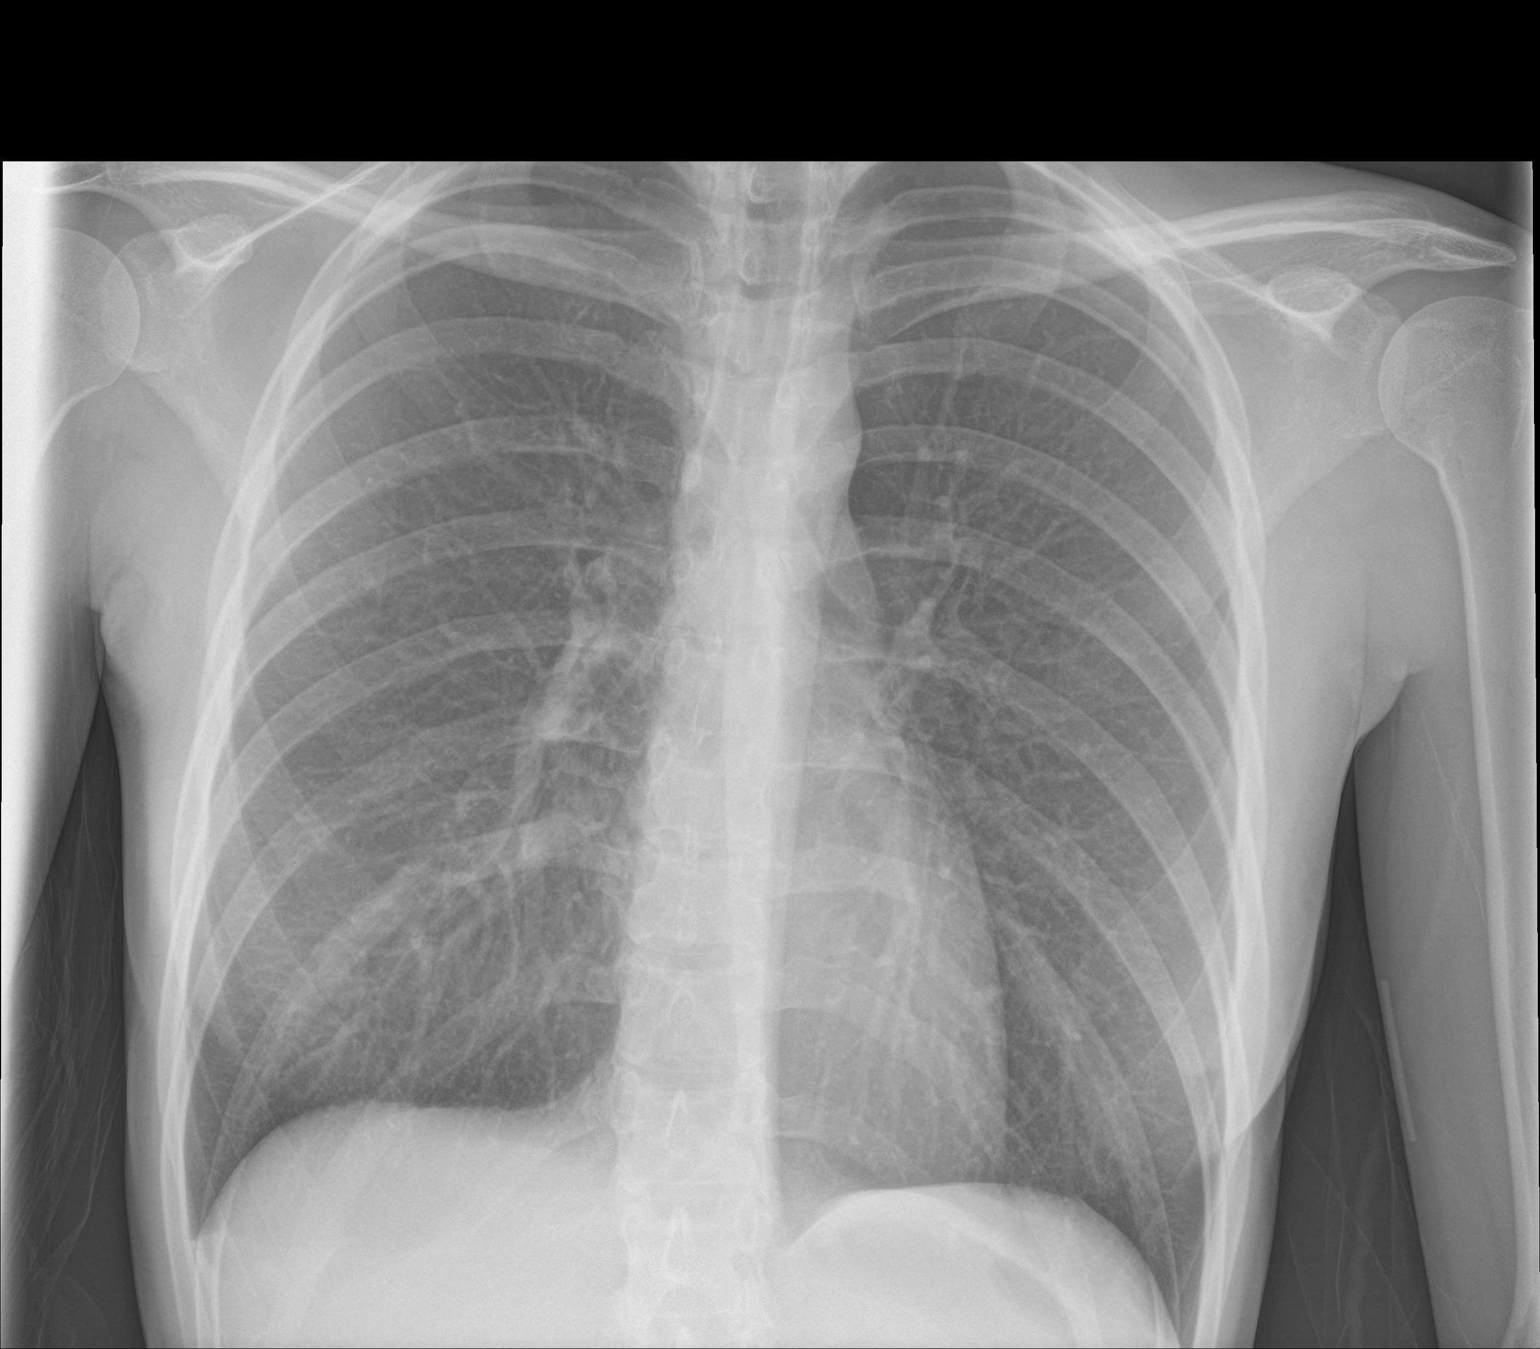

[chest ap (2 of 2)]
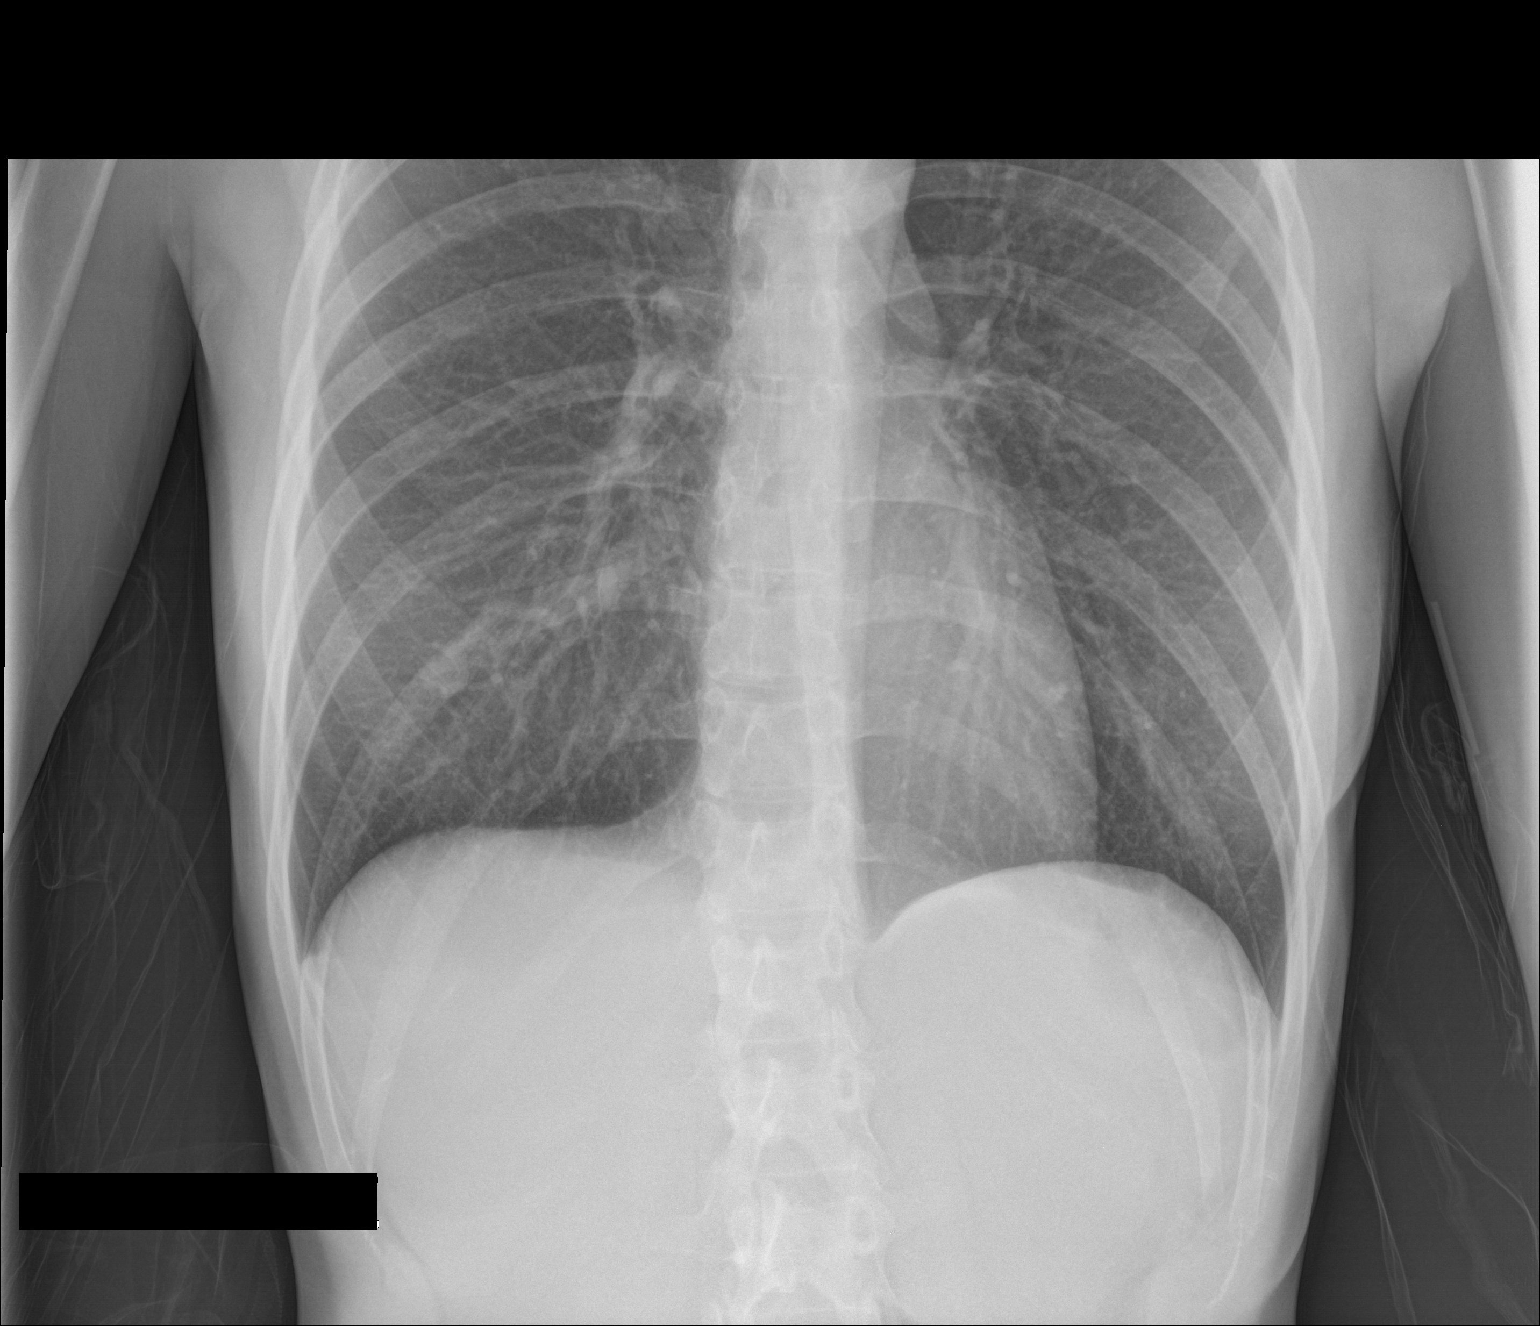

[2 of 2 positions shown; findings below may reference images not displayed]

FINDINGS: The heart size and mediastinal contours are within normal limits.
Both lungs are clear. The visualized skeletal structures are
unremarkable.
IMPRESSION: Normal chest x-ray.  No evidence of pneumonia or pulmonary edema.

## 2021-05-02 ENCOUNTER — Other Ambulatory Visit: Payer: Self-pay

## 2021-05-02 ENCOUNTER — Encounter (HOSPITAL_BASED_OUTPATIENT_CLINIC_OR_DEPARTMENT_OTHER): Payer: Self-pay | Admitting: *Deleted

## 2021-05-02 DIAGNOSIS — F12188 Cannabis abuse with other cannabis-induced disorder: Secondary | ICD-10-CM | POA: Insufficient documentation

## 2021-05-02 DIAGNOSIS — R1110 Vomiting, unspecified: Secondary | ICD-10-CM | POA: Diagnosis present

## 2021-05-02 DIAGNOSIS — F17210 Nicotine dependence, cigarettes, uncomplicated: Secondary | ICD-10-CM | POA: Diagnosis not present

## 2021-05-02 DIAGNOSIS — D72829 Elevated white blood cell count, unspecified: Secondary | ICD-10-CM | POA: Insufficient documentation

## 2021-05-02 DIAGNOSIS — R197 Diarrhea, unspecified: Secondary | ICD-10-CM | POA: Diagnosis not present

## 2021-05-02 LAB — COMPREHENSIVE METABOLIC PANEL
ALT: 15 U/L (ref 0–44)
AST: 34 U/L (ref 15–41)
Albumin: 6.1 g/dL — ABNORMAL HIGH (ref 3.5–5.0)
Alkaline Phosphatase: 58 U/L (ref 38–126)
Anion gap: 17 — ABNORMAL HIGH (ref 5–15)
BUN: 15 mg/dL (ref 6–20)
CO2: 22 mmol/L (ref 22–32)
Calcium: 10.6 mg/dL — ABNORMAL HIGH (ref 8.9–10.3)
Chloride: 98 mmol/L (ref 98–111)
Creatinine, Ser: 0.81 mg/dL (ref 0.44–1.00)
GFR, Estimated: >60 mL/min (ref >60)
Glucose, Bld: 140 mg/dL — ABNORMAL HIGH (ref 70–99)
Potassium: 4.1 mmol/L (ref 3.5–5.1)
Sodium: 137 mmol/L (ref 135–145)
Total Bilirubin: 1.4 mg/dL — ABNORMAL HIGH (ref 0.3–1.2)
Total Protein: 9 g/dL — ABNORMAL HIGH (ref 6.5–8.1)

## 2021-05-02 LAB — CBC
HCT: 46.1 % — ABNORMAL HIGH (ref 36.0–46.0)
Hemoglobin: 17 g/dL — ABNORMAL HIGH (ref 12.0–15.0)
MCH: 33.5 pg (ref 26.0–34.0)
MCHC: 36.9 g/dL — ABNORMAL HIGH (ref 30.0–36.0)
MCV: 90.7 fL (ref 80.0–100.0)
Platelets: 228 10*3/uL (ref 150–400)
RBC: 5.08 MIL/uL (ref 3.87–5.11)
RDW: 11.9 % (ref 11.5–15.5)
WBC: 19.2 10*3/uL — ABNORMAL HIGH (ref 4.0–10.5)
nRBC: 0 % (ref 0.0–0.2)

## 2021-05-02 LAB — LIPASE, BLOOD: Lipase: 21 U/L (ref 11–51)

## 2021-05-02 MED ORDER — ONDANSETRON 4 MG PO TBDP: 4.0000 mg | ORAL_TABLET | Freq: Once | ORAL | Status: AC | PRN

## 2021-05-02 MED ORDER — ONDANSETRON 4 MG PO TBDP
ORAL_TABLET | ORAL | Status: AC
  Administered 2021-05-02: 4 mg via ORAL
  Filled 2021-05-02: qty 1

## 2021-05-02 NOTE — ED Triage Notes (Signed)
Pt reports "over 100" episodes of n/v/d since 0400 today

## 2021-05-03 ENCOUNTER — Emergency Department (HOSPITAL_BASED_OUTPATIENT_CLINIC_OR_DEPARTMENT_OTHER)
Admission: EM | Admit: 2021-05-03 | Discharge: 2021-05-03 | Disposition: A | Discharge status: Home / Self Care | Payer: Medicaid Other | Attending: Emergency Medicine | Admitting: Emergency Medicine

## 2021-05-03 DIAGNOSIS — F12188 Cannabis abuse with other cannabis-induced disorder: Secondary | ICD-10-CM

## 2021-05-03 LAB — URINALYSIS, ROUTINE W REFLEX MICROSCOPIC
Bilirubin Urine: NEGATIVE
Glucose, UA: NEGATIVE mg/dL
Ketones, ur: 15 mg/dL — AB
Leukocytes,Ua: NEGATIVE
Nitrite: NEGATIVE
Protein, ur: 100 mg/dL — AB
Specific Gravity, Urine: >=1.03 (ref 1.005–1.030)
pH: 6.5 (ref 5.0–8.0)

## 2021-05-03 LAB — URINALYSIS, MICROSCOPIC (REFLEX)

## 2021-05-03 LAB — PREGNANCY, URINE: Preg Test, Ur: NEGATIVE

## 2021-05-03 MED ORDER — LACTATED RINGERS IV BOLUS
1000.0000 mL | Freq: Once | INTRAVENOUS | Status: AC
  Administered 2021-05-03: 1000 mL via INTRAVENOUS

## 2021-05-03 MED ORDER — LORAZEPAM 2 MG/ML IJ SOLN
1.0000 mg | Freq: Once | INTRAMUSCULAR | Status: AC
  Administered 2021-05-03: 1 mg via INTRAVENOUS
  Filled 2021-05-03: qty 1

## 2021-05-03 MED ORDER — DROPERIDOL 2.5 MG/ML IJ SOLN
2.5000 mg | Freq: Once | INTRAMUSCULAR | Status: AC
  Administered 2021-05-03: 2.5 mg via INTRAVENOUS
  Filled 2021-05-03: qty 2

## 2021-05-03 MED ORDER — PROMETHAZINE HCL 12.5 MG RE SUPP: 12.5000 mg | Freq: Four times a day (QID) | RECTAL | 0 refills | Status: DC | PRN

## 2021-05-03 NOTE — ED Notes (Signed)
Pt experiencing anxiety after droperidol. Dr. Madilyn Hook notified

## 2021-05-03 NOTE — ED Notes (Signed)
Discharge instructions discussed with pt. Pt verbalized understanding. Pt stable and ambulatory. No signature pad available. 

## 2021-05-03 NOTE — ED Provider Notes (Signed)
[facility] EMERGENCY DEPARTMENT Provider Note   CSN: [id number] Arrival date & time: 05/02/21  2212     History Chief Complaint  Patient presents with   Emesis    Tiffany Williamson is a 25 y.o. female.  The history is provided by the patient.  Emesis Tiffany Williamson is a 25 y.o. female who presents to the Emergency Department complaining of abdominal pain and vomiting.  She presents to the ED accompanied by her brother for evaluation of vomiting and abdominal pain.  Sxs started at 330 this morning.  She has generalized abdominal pain with associated numerous episodes of emesis that started clear, last episode had some blood mixed in.  Has associated diarrhea.  Currently on abx for last three days for UTI (not sure what the med is).  Has mild dysuria.    Usually uses suppositories for vomiting but is out of these.    Uses tobacco.  No alcohol.  Uses marijuana daily.  Hot water bottle helps her abdominal pain.      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 No pertinent surgical history.   OB History   No obstetric history on file.     Family History  Problem Relation Age of Onset   Healthy Mother    Breast cancer Maternal Grandmother     Social History   Tobacco Use   Smoking status: Every Day    Packs/day: 0.50    Types: Cigarettes   Smokeless tobacco: Never  Vaping Use   Vaping Use: Never used  Substance Use Topics   Alcohol use: Not Currently   Drug use: Yes     Types: Marijuana    Home Medications Prior to Admission medications   Medication Sig Start Date End Date Taking? Authorizing Provider  cetirizine-pseudoephedrine (ZYRTEC-D) 5-120 MG tablet Take 1 tablet by mouth daily. 09/12/17   Cathie HoopsYu, Amy V, PA-C  chlorproMAZINE (THORAZINE) 25 MG tablet Take 1 tablet (25 mg total) by mouth 3 (three) times daily as needed for nausea or vomiting. 06/27/17   Gwyneth SproutPlunkett, Whitney, MD  FLUoxetine (PROZAC) 20 MG capsule Take 20 mg by mouth daily.    [provider]  FLUoxetine (PROZAC) 20 MG capsule Take by mouth.    [provider]  fluticasone (FLONASE) 50 MCG/ACT nasal spray Place 2 sprays into both nostrils daily. 09/12/17   Cathie HoopsYu, Amy V, PA-C  ipratropium (ATROVENT) 0.06 % nasal spray Place 2 sprays into both nostrils 4 (four) times daily. 09/12/17   Cathie HoopsYu, Amy V, PA-C  meloxicam (MOBIC) 7.5 MG tablet Take 1 tablet (7.5 mg total) by mouth daily. 09/12/17   Cathie HoopsYu, Amy V, PA-C  metoCLOPramide (REGLAN) 10 MG tablet Take 1 tablet (10 mg total) by mouth every 8 (eight) hours as needed for nausea. 01/01/18   Benjiman CorePickering, Nathan, MD  nicotine (NICODERM CQ - DOSED IN MG/24 HOURS) 14 mg/24hr patch Place 1 patch (14 mg total) onto the skin daily.  09/14/17   Bland, Scott, DO  ondansetron (ZOFRAN ODT) 4 MG disintegrating tablet Take 1 tablet (4 mg total) by mouth every 8 (eight) hours as needed for nausea or vomiting. 08/13/17   Little, Ambrose Finland, MD  ondansetron [facility]) 4 MG/5ML solution Take 10 mLs (8 mg total) by mouth every 8 (eight) hours as needed for nausea or vomiting. 01/05/18   Garnette Gunner, MD  pantoprazole (PROTONIX) 40 MG tablet Take 1 tablet (40 mg total) by mouth daily. 10/30/16   Arnetha Courser, MD  potassium chloride SA (K-DUR,KLOR-CON) 20 MEQ tablet Take 1 tablet (20 mEq total) by mouth 2 (two) times daily. 03/04/18   Dione Booze, MD  prochlorperazine (COMPAZINE) 10 MG tablet Take 1 tablet (10 mg total) by mouth every 6 (six) hours as needed for nausea or  vomiting. 03/04/18   Dione Booze, MD  promethazine (PHENERGAN) 12.5 MG suppository Place 1 suppository (12.5 mg total) rectally every 6 (six) hours as needed for nausea or vomiting. 05/03/21   Tilden Fossa, MD  saccharomyces boulardii (FLORASTOR) 250 MG capsule Take 1 capsule (250 mg total) by mouth 2 (two) times daily. 04/26/17   Meredeth Ide, MD    Allergies    Cinnamon, Reglan [metoclopramide], and Nickel  Review of Systems   Review of Systems  Gastrointestinal:  Positive for vomiting.  All other systems reviewed and are negative.  Physical Exam Updated Vital Signs BP 108/62   Pulse 96   Temp 99.7 F (37.6 C) (Tympanic)   Resp 20   Ht 5\' 3"  (1.6 m)   Wt 45.8 kg   LMP 04/11/2021   SpO2 100%   BMI 17.89 kg/m   Physical Exam Vitals and nursing note reviewed.  Constitutional:      Appearance: She is well-developed.  HENT:     Head: Normocephalic and atraumatic.  Cardiovascular:     Rate and Rhythm: Normal rate and regular rhythm.     Heart sounds: No murmur heard. Pulmonary:     Effort: Pulmonary effort is normal. No respiratory distress.     Breath sounds: Normal breath sounds.  Abdominal:     Palpations: Abdomen is soft.     Tenderness: There is no guarding or rebound.     Comments: Mild generalized abdominal tenderness  Musculoskeletal:        General: No tenderness.  Skin:    General: Skin is warm and dry.  Neurological:     Mental Status: She is alert and oriented to person, place, and time.  Psychiatric:        Behavior: Behavior normal.    ED Results / Procedures / Treatments   Labs (all labs ordered are listed, but only abnormal results are displayed) Labs Reviewed  COMPREHENSIVE METABOLIC PANEL - Abnormal; Notable for the following components:      Result Value   Glucose, Bld 140 (*)    Calcium 10.6 (*)    Total Protein 9.0 (*)    Albumin 6.1 (*)    Total Bilirubin 1.4 (*)    Anion gap 17 (*)    All other components within normal limits  CBC  - Abnormal; Notable for the following components:   WBC 19.2 (*)    Hemoglobin 17.0 (*)    HCT 46.1 (*)    MCHC 36.9 (*)    All other components within normal limits  URINALYSIS, ROUTINE W REFLEX MICROSCOPIC - Abnormal; Notable for the following components:   APPearance CLOUDY (*)    Hgb  urine dipstick SMALL (*)    Ketones, ur 15 (*)    Protein, ur 100 (*)    All other components within normal limits  URINALYSIS, MICROSCOPIC (REFLEX) - Abnormal; Notable for the following components:   Bacteria, UA FEW (*)    All other components within normal limits  LIPASE, BLOOD  PREGNANCY, URINE    EKG None  Radiology No results found.  Procedures Procedures   Medications Ordered in ED Medications  ondansetron (ZOFRAN-ODT) disintegrating tablet 4 mg (4 mg Oral Given 05/02/21 2257)  lactated ringers bolus 1,000 mL (0 mLs Intravenous Stopped 05/03/21 0325)  droperidol (INAPSINE) 2.5 MG/ML injection 2.5 mg (2.5 mg Intravenous Given 05/03/21 0253)  LORazepam (ATIVAN) injection 1 mg (1 mg Intravenous Given 05/03/21 0306)    ED Course  I have reviewed the triage vital signs and the nursing notes.  Pertinent labs & imaging results that were available during my care of the patient were reviewed by me and considered in my medical decision making (see chart for details).    MDM Rules/Calculators/A&P                          patient with history of recurrent episodes of vomiting here for evaluation of vomiting since this morning with diarrhea. She was recently treated with antibiotics for urinary tract infection. UA is not consistent with UTI. CBC was leukocytosis, similar when compared to priors in the system. BMP without significant electrolyte abnormalities. She was treated with droperidol, fluids for her dehydration and emesis. She did develop some anxiety during her stay and was treated with a dose of Ativan. On reassessment she is feeling improved without recurrent vomiting. Discussed with  patient concern for cannabis hyper emesis given her recurrent symptoms in setting of cannabis use. Discussed concerns for cannabis dependence and recommend that she seek treatment for her cannabis abuse. Attempted to contact patient's power of attorney with no answer, full voicemail.  Final Clinical Impression(s) / ED Diagnoses Final diagnoses:  Cannabis hyperemesis syndrome concurrent with and due to cannabis abuse [facility])    Rx / DC Orders ED Discharge Orders          Ordered    promethazine (PHENERGAN) 12.5 MG suppository  Every 6 hours PRN        05/03/21 0433             Quintella Reichert, MD 05/03/21 [phone]

## 2021-11-30 ENCOUNTER — Encounter: Payer: Self-pay | Admitting: *Deleted

## 2022-04-14 ENCOUNTER — Encounter (HOSPITAL_COMMUNITY): Payer: Self-pay

## 2022-04-14 ENCOUNTER — Ambulatory Visit (HOSPITAL_COMMUNITY)
Admission: RE | Admit: 2022-04-14 | Discharge: 2022-04-14 | Disposition: A | Discharge status: Home / Self Care | Payer: Medicaid Other | Source: Ambulatory Visit | Attending: Family Medicine | Admitting: Family Medicine

## 2022-04-14 ENCOUNTER — Other Ambulatory Visit: Payer: Self-pay

## 2022-04-14 VITALS — BP 108/77 | HR 92 | Temp 98.2°F | Resp 18

## 2022-04-14 DIAGNOSIS — R070 Pain in throat: Secondary | ICD-10-CM | POA: Diagnosis present

## 2022-04-14 DIAGNOSIS — J029 Acute pharyngitis, unspecified: Secondary | ICD-10-CM | POA: Insufficient documentation

## 2022-04-14 LAB — POCT RAPID STREP A, ED / UC: Streptococcus, Group A Screen (Direct): NEGATIVE

## 2022-04-14 MED ORDER — IBUPROFEN 600 MG PO TABS: 600.0000 mg | ORAL_TABLET | Freq: Three times a day (TID) | ORAL | 0 refills | Status: AC | PRN

## 2022-04-14 NOTE — Discharge Instructions (Addendum)
Your strep test is negative.  Culture of the throat will be sent, and staff will notify you if that is in turn positive.  Take ibuprofen 600 mg--1 tab every 8 hours as needed for pain.

## 2022-04-14 NOTE — ED Triage Notes (Signed)
Onset yesterday of symptoms.  Complains of sore throat, headache.  Needs work not for calling out yesterday

## 2022-04-14 NOTE — ED Provider Notes (Signed)
[facility]    CSN: [id number] Arrival date & time: 04/14/22  1428      History   Chief Complaint Chief Complaint  Patient presents with   Sore Throat    Entered by patient   Appointment    2:30    HPI Tiffany Williamson is a 26 y.o. female.    Sore Throat   Here with sore throat that began yesterday.  For 2 or 3 days she has had a headache.  No fever and no chills.  No cough.she has had a little congestion in her nose at night when she lies down.  No ear pain.  No nausea or vomiting or diarrhea.  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 No pertinent surgical history.  OB History   No obstetric history on file.      Home Medications    Prior to Admission medications   Medication Sig Start Date End Date Taking? Authorizing Provider  ibuprofen (ADVIL) 600 MG tablet Take 1 tablet (600 mg total) by mouth every 8 (eight) hours as needed (pain). 04/14/22  Yes Barrett Henle, MD  FLUoxetine (PROZAC) 20 MG capsule Take by mouth.    [provider]    Family History Family History  Problem Relation Age of Onset   Healthy Mother    Breast cancer Maternal Grandmother     Social History Social History   Tobacco Use   Smoking status: Every Day    Packs/day: 0.50    Types: Cigarettes   Smokeless tobacco: Never  Vaping Use   Vaping Use: Some days  Substance Use Topics   Alcohol use: Not  Currently   Drug use: Yes    Types: Marijuana     Allergies   Cinnamon, Reglan [metoclopramide], and Nickel   Review of Systems Review of Systems   Physical Exam Triage Vital Signs ED Triage Vitals  Enc Vitals Group     BP 04/14/22 1443 108/77     Pulse Rate 04/14/22 1443 92     Resp 04/14/22 1443 18     Temp 04/14/22 1443 98.2 F (36.8 C)     Temp Source 04/14/22 1443 Oral     SpO2 04/14/22 1443 98 %     Weight --      Height --      Head Circumference --      Peak Flow --      Pain Score 04/14/22 1440 3     Pain Loc --      Pain Edu? --      Excl. in [place]? --    No data found.  Updated Vital Signs BP 108/77 (BP Location: Right Arm)   Pulse 92   Temp 98.2 F (36.8 C) (Oral)   Resp 18   LMP 03/27/2022   SpO2 98%   Visual Acuity Right Eye Distance:  Left Eye Distance:   Bilateral Distance:    Right Eye Near:   Left Eye Near:    Bilateral Near:     Physical Exam Vitals reviewed.  Constitutional:      General: She is not in acute distress.    Appearance: She is not ill-appearing, toxic-appearing or diaphoretic.  HENT:     Right Ear: Tympanic membrane and ear canal normal.     Left Ear: Tympanic membrane and ear canal normal.     Nose: Nose normal.     Mouth/Throat:     Mouth: Mucous membranes are moist.     Comments: There is mild erythema of the oropharynx and the tonsillar pillars.  There is clear mucus draining in the oropharynx Eyes:     Extraocular Movements: Extraocular movements intact.     Conjunctiva/sclera: Conjunctivae normal.     Pupils: Pupils are equal, round, and reactive to light.  Cardiovascular:     Rate and Rhythm: Normal rate and regular rhythm.     Heart sounds: No murmur heard. Pulmonary:     Effort: Pulmonary effort is normal. No respiratory distress.     Breath sounds: No stridor. No wheezing, rhonchi or rales.  Musculoskeletal:     Cervical back: Neck supple.  Lymphadenopathy:     Cervical: No cervical adenopathy.   Skin:    Capillary Refill: Capillary refill takes less than 2 seconds.     Coloration: Skin is not jaundiced or pale.  Neurological:     General: No focal deficit present.     Mental Status: She is alert and oriented to person, place, and time.  Psychiatric:        Behavior: Behavior normal.      UC Treatments / Results  Labs (all labs ordered are listed, but only abnormal results are displayed) Labs Reviewed  CULTURE, GROUP A STREP [facility])  POCT RAPID STREP A, ED / UC    EKG   Radiology No results found.  Procedures Procedures (including critical care time)  Medications Ordered in UC Medications - No data to display  Initial Impression / Assessment and Plan / UC Course  I have reviewed the triage vital signs and the nursing notes.  Pertinent labs & imaging results that were available during my care of the patient were reviewed by me and considered in my medical decision making (see chart for details).        Strep test is negative, so we will culture and treat per protocol if positive  Ibuprofen as needed for the pain.  We discussed possibly doing a COVID swab, and we decided against it Final Clinical Impressions(s) / UC Diagnoses   Final diagnoses:  Throat pain  Acute pharyngitis, unspecified etiology     Discharge Instructions      Your strep test is negative.  Culture of the throat will be sent, and staff will notify you if that is in turn positive.  Take ibuprofen 600 mg--1 tab every 8 hours as needed for pain.      ED Prescriptions     Medication Sig Dispense Auth. Provider   ibuprofen (ADVIL) 600 MG tablet Take 1 tablet (600 mg total) by mouth every 8 (eight) hours as needed (pain). 21 tablet Marianita Botkin, Gwenlyn Perking, MD      PDMP not reviewed this encounter.   Barrett Henle, MD 04/14/22 [phone]

## 2022-04-17 LAB — CULTURE, GROUP A STREP (THRC)
